# Patient Record
Sex: Male | Born: 1950 | Race: Asian | Hispanic: No | Marital: Married | Smoking: Former smoker
Health system: Southern US, Community
[De-identification: ages and names within clinical notes are randomized; demographics above are authoritative.]

## PROBLEM LIST (undated history)

## (undated) DIAGNOSIS — I10 Essential (primary) hypertension: Secondary | ICD-10-CM

## (undated) DIAGNOSIS — E785 Hyperlipidemia, unspecified: Secondary | ICD-10-CM

## (undated) DIAGNOSIS — G463 Brain stem stroke syndrome: Secondary | ICD-10-CM

## (undated) DIAGNOSIS — C801 Malignant (primary) neoplasm, unspecified: Secondary | ICD-10-CM

## (undated) DIAGNOSIS — R972 Elevated prostate specific antigen [PSA]: Secondary | ICD-10-CM

## (undated) DIAGNOSIS — R7303 Prediabetes: Secondary | ICD-10-CM

## (undated) DIAGNOSIS — D170 Benign lipomatous neoplasm of skin and subcutaneous tissue of head, face and neck: Secondary | ICD-10-CM

## (undated) DIAGNOSIS — Z87898 Personal history of other specified conditions: Secondary | ICD-10-CM

## (undated) HISTORY — DX: Essential (primary) hypertension: I10

## (undated) HISTORY — DX: Hyperlipidemia, unspecified: E78.5

## (undated) HISTORY — DX: Personal history of other specified conditions: Z87.898

## (undated) HISTORY — DX: Benign lipomatous neoplasm of skin and subcutaneous tissue of head, face and neck: D17.0

## (undated) HISTORY — DX: Brain stem stroke syndrome: G46.3

---

## 2008-04-20 ENCOUNTER — Other Ambulatory Visit: Payer: Self-pay

## 2008-04-20 ENCOUNTER — Inpatient Hospital Stay: Payer: Self-pay | Admitting: Internal Medicine

## 2008-04-26 ENCOUNTER — Ambulatory Visit: Payer: Self-pay | Admitting: Physical Medicine & Rehabilitation

## 2008-04-26 ENCOUNTER — Inpatient Hospital Stay (HOSPITAL_COMMUNITY)
Admission: RE | Admit: 2008-04-26 | Discharge: 2008-05-07 | Payer: Self-pay | Admitting: Physical Medicine & Rehabilitation

## 2008-05-02 DIAGNOSIS — G463 Brain stem stroke syndrome: Secondary | ICD-10-CM

## 2008-05-02 HISTORY — DX: Brain stem stroke syndrome: G46.3

## 2010-12-15 NOTE — Discharge Summary (Signed)
[name], [name]                     ACCOUNT NO.:  [id number]   MEDICAL RECORD NO.:  [id number]          PATIENT TYPE:  IPS   LOCATION:  4035                         FACILITY:  [facility]   PHYSICIAN:  Mariam Dollar, P.A.  DATE OF BIRTH:  25-Jan-1951   DATE OF ADMISSION:  04/26/2008  DATE OF DISCHARGE:  05/07/2008                               DISCHARGE SUMMARY   DISCHARGE ADDENDUM   The patient initially scheduled for discharge, May 03, 2008, however,  needed to gain improved functional mobility.  Prior to discharged to  home, there was some consideration.  He might need nursing home  placement due to the fact that his wife worked.  This was discussed at  length, wife felt that could be worked out between her work physician  and be able to provide necessary supervision in her home.  The patient  could be discharged to home.  Thus, full family teaching was completed.  He was discharged on May 07, 2008.  His diet remained unchanged,  mechanical soft nectar-thick liquids.  His blood pressures were well  controlled.  He would remain on his Lopressor, Vasotec, Norvasc, and  clonidine, and follow up with his primary care [name].  We will  continue to follow the patient for issues regarding his stroke on an  outpatient basis with Dr. Thomasena Edis.      Mariam Dollar, P.A.     DA/MEDQ  D:  05/06/2008  T:  05/06/2008  Job:  [id number]

## 2010-12-15 NOTE — H&P (Signed)
NAMEAMADOU, KATZENSTEIN                     ACCOUNT NO.:  [id number]   MEDICAL RECORD NO.:  [id number]          PATIENT TYPE:  IPS   LOCATION:  4035                         FACILITY:  [facility]   PHYSICIAN:  Ranelle Oyster, M.D.DATE OF BIRTH:  June 21, 1951   DATE OF ADMISSION:  04/26/2008  DATE OF DISCHARGE:                              HISTORY & PHYSICAL   CHIEF COMPLAINT:  Right-sided sensory loss and ataxia.   HISTORY OF PRESENT ILLNESS:  This is a 60 year old Asian male with  uncontrolled hypertension admitted to [facility]  on April 20, 2008 with nausea and dizziness with noted right-sided  weakness.  Blood pressure on admission was 205/112.  The patient was  placed on low-dose labetalol.  CT showed a basal ganglia infarct by  report.  There was a question whether this was old or new.  Upon looking  at discharge summary from [facility], MRI showed some subcortical  and deep white matter chronic changes.  There is discussion of Neurology  consult where the neurologist felt that the patient had a small  brainstem stroke.  Carotid Dopplers were negative.  Echocardiogram was  unremarkable.  The patient was placed on Plavix for stroke prophylaxis.  Blood pressures continued to be variable and other medications were held  including Vasotec, Norvasc, clonidine, and metoprolol.  The patient was  placed on subcu Lovenox for DVT prophylaxis.  He is requiring D3 honey-  thick liquid diet due to dysphagia.  The patient continues to struggle  with mobility swallowing and self-care and thus was admitted to the  inpatient rehab unit today.   REVIEW OF SYSTEMS:  Notable for hiccups, dizziness particularly when  changing physicians.  He has had some nausea but this has mostly  resolved.  He denies shortness of breath and chest pain.  He does  complain of numbness on the right arm and right face.  Full review is in  the written history and physical.   PAST MEDICAL HISTORY:   Positive for uncontrolled hypertension.   PAST SURGICAL HISTORY:  He has no surgical history.   HABITS:  He remotely smoked and does not drink.   FAMILY HISTORY:  Positive hypertension and hyperlipidemia.   SOCIAL HISTORY:  The patient is married, lives in [place], and owns a  [employer], trade union.  He has a 2-level house with bedroom upstairs and 3-4 steps  to enter.  Wife also works at the family business.   FUNCTIONAL HISTORY:  The patient is independent prior to arrival.  Currently, he is requiring min assist for gait and self-care due to  ataxia.   ALLERGIES:  None.   HOME MEDICATIONS:  None.   LABORATORY DATA:  Hemoglobin 16, white count 13, and platelets 265.  Sodium 139, potassium 3.9, BUN and creatinine 16 and 0.9.   PHYSICAL EXAMINATION:  VITAL SIGNS:  Blood pressure is 132/83, pulse is  76, respiratory rate 17, and temperature 98.8.  GENERAL:  The patient is pleasant, lying in bed.  Pupils equally round  and reactive to light.  EARS, NOSE, AND THROAT:  Unremarkable with pink moist mucosa and intact  dentition.  NECK:  Supple without JVD or lymphadenopathy.  CHEST:  Clear to auscultation bilaterally without wheezes, rales, or  rhonchi.  HEART:  Regular rate and rhythm without murmurs, rubs, or gallops.  ABDOMEN:  Soft, nontender.  Bowel sounds are positive.  SKIN:  Intact.  NEUROLOGIC:  Cranial nerve exam revealed decreased sensation over the  right face.  Visual tracking was intact, although he did have some  nystagmus with end range lateral movements.  Reflexes were 1+.  Sensation was decreased to 1/2 with the right arm and right face, but  not the right leg today.  Judgment, orientation, memory, and mood were  all within normal limits.  The patient did fall to the right when we sat  him up.  Romberg test was positive with lean to the right.  He had  minimal limb ataxia and more truncal ataxia than anything else today.  Strength was 5/5 in all 4 extremities.    ASSESSMENT AND PLAN:  1. Functional deficits secondary to acute cerebrovascular accident,      perhaps in the right lateral medullary area.  There is second-hand      report that the patient did suffer brainstem stroke, although      nothing is described on either of his radiographic reports.  The      patient with ongoing ataxia and significant dysphagia.  The patient      was admitted to receive collaborative interdisciplinary care      between the physiatrist, rehab nursing staff, and therapy team.      The patient's level of medical complexity and substantial therapy      needs in context of that medical necessity cannot be provided at a      lesser intensity of care.  Physiatrist will provide 24-hour      management of medical needs as well as oversight of therapy plan      and provide guidance as appropriate regarding interaction of the      two.  24-hour rehab nursing will assist in management of the      patient's safety issues as well as bowel and bladder incontinence,      appropriate nutrition, and management of nausea if this persists.      They also will help integrate therapy concepts, techniques,      education, etc.  PT will assess and treat for ataxia including      adaptive equipment as appropriate and desensitizing techniques for      loss of balance and position sense.  OT will assess and treat for      appropriate equipment, safety, and ADLs.  Speech language pathology      will assess and follow for ongoing dysphagia.  Case      management/social worker will assess and treat for psychosocial      issues and discharge planning.  The patient will receive 3 hours      therapy per day at least 5 days a week.  Team conferences will be      held weekly to establish goals, assess progress, and to determine      barriers to discharge.  Rehab goals are modified independent.      Estimated length of stay is 7 days.  Prognosis is good.  2. Blood pressure:  Continue to monitor  serially on metoprolol,      Vasotec, Norvasc, and clonidine.  Blood pressure was normal on  evaluation today.  3. Hyperlipidemia:  Continue Lipitor.  4. Stroke prophylaxis with Plavix as well as blood pressure:  Some      control as noted above.  5. Hiccups:  Discontinue Thorazine, begin scheduled baclofen as the      patient states Thorazine was not helpful for symptoms.  6. Deep venous thrombosis prophylaxis:  Subcu Lovenox for now.      Ranelle Oyster, M.D.  Electronically Signed     ZTS/MEDQ  D:  04/26/2008  T:  04/27/2008  Job:  [id number]

## 2010-12-15 NOTE — Discharge Summary (Signed)
NAMESHLOMA, ROGGENKAMP                     ACCOUNT NO.:  [id number]   MEDICAL RECORD NO.:  [id number]          PATIENT TYPE:  IPS   LOCATION:  4035                         FACILITY:  [facility]   PHYSICIAN:  Ellwood Dense, M.D.   DATE OF BIRTH:  1951-01-30   DATE OF ADMISSION:  04/26/2008  DATE OF DISCHARGE:  05/03/2008                               DISCHARGE SUMMARY   DISCHARGE DIAGNOSES:  1. Cerebrovascular accident.  2. Questionable old lacunar left basal ganglia infarction.  3. Dysphagia.  4. Hypertension.  5. Hyperlipidemia.  6. Hiccups.  7. Subcutaneous Lovenox for deep vein thrombosis prophylaxis.   HISTORY OF PRESENT ILLNESS:  This is a 60 year old Asian male with  history of untreated hypertension, admitted to [facility] on April 20, 2008, with nausea, dizziness, and right-sided  weakness.  Noted blood pressure 205, diastolic 112, placed on low-dose  labetalol.  Cranial CT scan showed old basal ganglia lacunar infarction  with diffuse atrophy.  Cardiac enzymes negative.  MRI with subcortical  deep white matter chronic ischemic changes.  Carotid Dopplers with  minimal plaque, no flow, limiting stenosis.  Echocardiogram with  ejection fraction of 55% without emboli.  Placed on Plavix for  cerebrovascular accident prophylaxis.  Blood pressure with variables  with metoprolol, Vasotec, Norvasc, and clonidine added.  He was  maintained on subcutaneous Lovenox for deep vein thrombosis prophylaxis.  A mechanical soft honey-thick liquid diet per Speech Therapy secondary  to dysphagia related to cerebrovascular accident.  He was admitted for  comprehensive rehab program.   PAST MEDICAL HISTORY:  See discharge diagnoses.  Remote smoker.  No  alcohol.   ALLERGIES:  None.   SOCIAL HISTORY:  He is married.  He lives in [place].  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  Wife works at the family business.   FUNCTIONAL  HISTORY PRIOR TO ADMISSION:  Independent.   FUNCTIONAL STATUS UPON ADMISSION:  Rehab Services with minimal assist  secondary to ataxia.   MEDICATIONS PRIOR TO ADMISSION:  None.   PHYSICAL EXAMINATION:  VITAL SIGNS:  Blood pressure 132/83, pulse 76,  temperature 98.8, and respirations 17.  NEUROLOGIC:  The patient was alert and oriented x3, followed simple  commands.  LUNGS:  Clear to auscultation.  CARDIAC:  Regular rate and rhythm.  ABDOMEN:  Soft and nontender.  Good bowel sounds.  EXTREMITIES:  Calves remain cool without any swelling, erythema, or  nontender.  He was ataxic with fine motor commands.   REHABILITATION HOSPITAL COURSE:  The patient was admitted to Inpatient  Rehab Services with therapies initiated on a 3-hour daily basis  consisting of physical therapy, occupational therapy, speech therapy,  and 24-hour rehabilitation nursing, and the following issues were  addressed during the patient's rehabilitation stay.   Pertaining to Mr. Pattillo cerebrovascular accident, he remained on Plavix  therapy.  Subcutaneous Lovenox ongoing for deep vein thrombosis  prophylaxis.  He was still somewhat ataxic and would need supervision at  home.  Overall, for his functional status,  he was supervision for  bathing, dressing, modified independent for toileting and toilet  transfer, supervision transfers, minimal assistance again for ambulation  secondary to his ataxia.  He was followed by Speech Therapy for  swallowing difficulties related to a stroke.  He is currently on a  mechanical soft diet with nectar thick liquids and would be followed up  per Speech Therapy.  Blood pressures monitored on metoprolol, Vasotec,  Norvasc, and clonidine.  It was stressed to the patient the need to  maintain these antihypertensive medications to control blood pressure.  It was to be arranged to follow up with a primary MD.  He was placed on  Lipitor during his workup for stroke for hyperlipidemia.  He  did have  some hiccups during his hospital course.  He had received baclofen with  relatively good results.  Weekly interdisciplinary team conferences were  held to discuss the patient's progress, estimated length of stays, any  barriers to discharge, and full family teaching being completed.  He had  no bowel or bladder disturbances.  He was discharged home on May 03, 2008.   DISCHARGE MEDICATIONS AT TIME OF DICTATION:  1. Crestor 20 mg at bedtime.  2. Lopressor 50 mg every 12 hours.  3. Vasotec 20 mg twice daily.  4. Norvasc 10 mg daily.  5. Plavix 75 mg daily.  6. Clonidine 0.1 mg twice daily.   DIET:  Mechanical soft nectar thick liquids.   INSTRUCTIONS:  Follow up per Speech Therapy.  He would follow up with  Dr. Ellwood Dense at the Outpatient Rehab Service Office as advised.  Arrangements had been made with case management to follow up with the  primary care Kamryn Messineo.  He was advised no smoking, no drinking, no  alcohol.      Mariam Dollar, P.A.    ______________________________  Ellwood Dense, M.D.    DA/MEDQ  D:  05/02/2008  T:  05/02/2008  Job:  [id number]

## 2011-05-03 LAB — COMPREHENSIVE METABOLIC PANEL
ALT: 76 — ABNORMAL HIGH
Alkaline Phosphatase: 81
Chloride: 102
Creatinine, Ser: 0.93
GFR calc Af Amer: >60
Total Bilirubin: 0.9

## 2011-05-03 LAB — CBC
HCT: 49.3
MCHC: 33.7
MCV: 88.6
Platelets: 272
RDW: 12.6
WBC: 11 — ABNORMAL HIGH

## 2011-05-03 LAB — DIFFERENTIAL
Lymphs Abs: 2.1
Monocytes Relative: 11
Neutro Abs: 7.5

## 2012-08-02 HISTORY — PX: LIPOMA EXCISION: SHX5283

## 2013-02-19 ENCOUNTER — Ambulatory Visit: Payer: Self-pay | Admitting: Anesthesiology

## 2013-04-18 ENCOUNTER — Ambulatory Visit: Payer: Self-pay | Admitting: Surgery

## 2013-04-24 ENCOUNTER — Ambulatory Visit: Payer: Self-pay | Admitting: Surgery

## 2013-04-24 LAB — CBC WITH DIFFERENTIAL/PLATELET
Basophil #: 0 10*3/uL (ref 0.0–0.1)
Basophil %: 0.2 %
HCT: 43 % (ref 40.0–52.0)
Lymphocyte #: 0.9 10*3/uL — ABNORMAL LOW (ref 1.0–3.6)
Lymphocyte %: 5.5 %
MCHC: 33.7 g/dL (ref 32.0–36.0)
MCV: 90 fL (ref 80–100)
Monocyte #: 0.2 x10 3/mm (ref 0.2–1.0)
Platelet: 200 10*3/uL (ref 150–440)
RDW: 12.5 % (ref 11.5–14.5)

## 2013-04-24 LAB — PROTIME-INR
INR: 1
Prothrombin Time: 13.3 secs (ref 11.5–14.7)

## 2013-04-26 LAB — PATHOLOGY REPORT

## 2014-11-22 NOTE — Op Note (Signed)
PATIENT NAMECONSTANTIN, [name] MR#:  [id number] DATE OF BIRTH:  11/22/1950  DATE OF PROCEDURE:  04/24/2013  PREOPERATIVE DIAGNOSIS: Subfascial lipoma of the neck.  POSTOPERATIVE DIAGNOSIS:  Subfascial lipoma of the neck.    PROCEDURE: Excision of lipoma the neck.   SURGEON: Rochel Brome, M.D.   ANESTHESIA: General.   INDICATIONS: This 64 year old male has a history of a large mass of the right side of the neck. According to the physical findings, it was approximately 12 cm in dimension and there appeared to be a low suspicion for a sarcoma but most likely a large lipoma. An excision was recommended due to potential for additional growth.   DESCRIPTION OF PROCEDURE:  The patient was placed on the operating table in the supine position under general anesthesia. A roll was placed behind the shoulder blades. The neck was extended. The head was turned 20 degrees to the left. The mass was readily identified and the site was prepared with ChloraPrep and draped in a sterile manner.   A curvilinear incision was made at the base of the neck in the supraclavicular area, approximately 7 cm in length.  It was carried down through subcutaneous tissues through the platysma. The external jugular vein was divided between 3-0 chromic suture ligatures. Dissection was carried down through some additional fatty tissue to encounter a lipoma which had a smooth plane of dissection.  This plane was dissected circumferentially around the lipoma. It did extend deeply within the neck, particularly posteriorly where it extended under a portion of trapezius. I palpated the first rib. There was also a portion of brachial plexus visualized anteriorly. The dissection of the lipoma was continued in a somewhat tedious fashion. Innumerable tiny blood vessels along the surface of the lipoma were divided with electrocautery. Numerous vessels were ligated with 3-0 and 4-0 ties and suture ligatures, and the lipoma was further dissected. There  were 2 vascular pedicles also ligated with 2-0 chromic and the lipoma was completely resected. The ex vivo measurement was 20 x 6 x 4 cm. It was placed in formalin for routine pathology. The wound was inspected and hemostasis appeared to be intact.   Next, the platysma was closed with interrupted inverted 3-0 chromic sutures and then the skin was closed with running 5-0 Monocryl subcuticular suture and Dermabond. The patient appeared to be in satisfactory condition and was maintained in reversed Trendelenburg position and prepared for transfer to the recovery room.   ____________________________ [name]. Rochel Brome, MD [email or web address] D: 04/24/2013 17:34:00 ET T: 04/24/2013 18:08:38 ET JOB#: [id number]  cc: Loreli Dollar, MD, <Dictator> Loreli Dollar MD ELECTRONICALLY SIGNED 04/25/2013 17:25

## 2014-11-22 NOTE — Op Note (Signed)
PATIENT NAMETORI, [name] MR#:  [id number] DATE OF BIRTH:  [birth year]  DATE OF PROCEDURE:  04/24/2013  PREOPERATIVE DIAGNOSIS: A postoperative hematoma of the neck.   POSTOPERATIVE DIAGNOSIS: A postoperative hematoma of the neck.  PROCEDURE:  Incision and drainage of hematoma with control of bleeding.   SURGEON: Rochel Brome, MD   ANESTHESIA: General.   INDICATIONS: This 64 year old had excision of a large subfascial hematoma of the right side of the neck earlier today and in the postoperative period has developed marked swelling at the operative site. It appeared that he may become in danger of respiratory distress and recommended incision and drainage.   DESCRIPTION OF PROCEDURE: The patient was placed on the operating table in the supine position. The rolled sheet was placed behind the shoulder blades. The neck and head was placed on a donut ring. The neck was extended. The operative site was prepared with ChloraPrep and draped in a sterile manner. Next, the skin at the anterior 3 cm of the incision was infiltrated with 1% Xylocaine with epinephrine and removed glue and incised through the same incision, dividing sutures, and demonstrated the presence of hematoma and evacuated blood with a suction cannula until it was fairly well decompressed; and after this, while maintaining the sterile field, the anesthesia staff placed him under general endotracheal anesthesia.   The rest of the wound was opened removing suture material. There was a wide area of ecchymosis. Some additional blood clots were evacuated with suction and manually.  The wound was then explored. There were no bleeding points identified, but several small bleeding points were cauterized. Malleable retractors were used to explore deeply within the wound as the wound did extend some 5 inches in the posterior direction, which is the site where the portion of the lipoma had been. It appeared that it was adequately decompressed, and the  wound was further explored, seeing no bleeding vessels other than a few small areas which were cauterized. Next, a 19-French  Blake drain was inserted and brought out through a stab wound which was some 4 cm lateral to the incision. This site was infiltrated with 1% Xylocaine, and I made a lancing incision with a 15 scalpel and inserted the Blake drain, brought out through the stab wound. The Blake drain was cut to fit, and using malleable retractors I could see the deepest posterior portion of the wound and passed the tip of the [place] drain down as far as it would go, and it appeared to lay satisfactorily; and after some other brief exploration, no other bleeding points were seen. The platysma was closed with interrupted inverted 3-0 chromic sutures. The skin was closed with a running 5-0 Monocryl subcuticular suture and Dermabond. It was further noted that the drain was secured with 3-0 nylon which that stitch was actually placed as soon as the drain was brought out. The drain site was also treated with Dermabond, and also this was dressed with benzoin and Tegaderm. I also applied a piece of 2-inch silk tape to secure the drain.  There was a minimal amount of serosanguineous drainage amounting to some 10 mL within the collection chamber which was activated.   The patient did have some hypotension during the procedure, was treated with fluids and body position and subsequently improved, and the patient is now being prepared for transfer to the recovery room.   ____________________________ Lenna Sciara. Rochel Brome, MD [email or web address] D: 04/24/2013 20:37:21 ET T: 04/24/2013 21:17:30 ET JOB#: [id number]  cc: Loreli Dollar,  MD, <Dictator> Loreli Dollar MD ELECTRONICALLY SIGNED 04/25/2013 17:26

## 2016-08-02 DIAGNOSIS — R972 Elevated prostate specific antigen [PSA]: Secondary | ICD-10-CM

## 2016-08-02 DIAGNOSIS — R7303 Prediabetes: Secondary | ICD-10-CM

## 2016-08-02 DIAGNOSIS — C801 Malignant (primary) neoplasm, unspecified: Secondary | ICD-10-CM

## 2016-08-02 HISTORY — DX: Malignant (primary) neoplasm, unspecified: C80.1

## 2016-08-02 HISTORY — DX: Elevated prostate specific antigen (PSA): R97.20

## 2016-08-02 HISTORY — DX: Prediabetes: R73.03

## 2017-05-17 ENCOUNTER — Ambulatory Visit: Payer: BLUE CROSS/BLUE SHIELD | Admitting: Urology

## 2017-05-17 ENCOUNTER — Encounter: Payer: Self-pay | Admitting: Urology

## 2017-05-17 VITALS — BP 172/89 | HR 70 | Ht 65.0 in | Wt 162.0 lb

## 2017-05-17 DIAGNOSIS — R972 Elevated prostate specific antigen [PSA]: Secondary | ICD-10-CM | POA: Diagnosis not present

## 2017-05-17 NOTE — Progress Notes (Signed)
05/17/2017 9:27 AM   [name] 07/19/1951 [id number]  Referring provider: Idelle Crouch, MD [facility] [place], [place] [address]  Chief Complaint  Patient presents with  . Elevated PSA    HPI: 66 year old male referred for further evaluation of elevated and rising PSA. PSA trend as below. His most recent PSA is now up to 5.61 on 04/18/2017.  Repeat PSA was drawn today and is pending.  Loss of bone pain.  No family history of prostate cancer  He has never had a previous prostate biopsy or evaluation for elevated PSA in the past.  He denies any baseline urinary symptoms.  He has an excellent stream, feels like he is able to empty his bladder completely, without urgency or frequency.  IPSS as below.  PSA: 1 11/13/2008 2.54 04/12/2014 3.13 10/29/2015 5.61 04/18/2017      IPSS    Row Name 05/17/17 1500         International Prostate Symptom Score   How often have you had the sensation of not emptying your bladder? Not at All     How often have you had to urinate less than every two hours? Not at All     How often have you found you stopped and started again several times when you urinated? Not at All     How often have you found it difficult to postpone urination? Not at All     How often have you had a weak urinary stream? Less than 1 in 5 times     How often have you had to strain to start urination? Not at All     How many times did you typically get up at night to urinate? 1 Time     Total IPSS Score 2       Quality of Life due to urinary symptoms   If you were to spend the rest of your life with your urinary condition just the way it is now how would you feel about that? Delighted        Score:  1-7 Mild 8-19 Moderate 20-35 Severe   PMH: Past Medical History:  Diagnosis Date  . Benign hypertension 05/22/2017  . 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 History of vertigo  05/22/2017  . Hyperlipidemia, unspecified 05/22/2017  . Lipoma of neck 05/22/2017   Overview:  right  . Type 2 diabetes mellitus ([place]) 05/22/2017    Surgical History: Past Surgical History:  Procedure Laterality Date  . LIPOMA EXCISION      Home Medications:  Allergies as of 05/17/2017   No Known Allergies     Medication List       Accurate as of 05/17/17 11:59 PM. Always use your most recent med list.          amLODipine 5 MG tablet Commonly known as:  NORVASC Take 1 tablet by mouth daily.   atorvastatin 40 MG tablet Commonly known as:  LIPITOR Take 1 tablet by mouth daily.   enalapril 20 MG tablet Commonly known as:  VASOTEC Take 1 tablet by mouth daily.   metoprolol tartrate 50 MG tablet Commonly known as:  LOPRESSOR Take 1 tablet by mouth daily.       Allergies: No Known Allergies  Family History: Family History  Problem Relation Age of Onset  . Heart attack Father     Social History:  reports that he has never  smoked. He has never used smokeless tobacco. His alcohol and drug histories are not on file.  ROS: UROLOGY Frequent Urination?: No Hard to postpone urination?: No Burning/pain with urination?: No Get up at night to urinate?: No Leakage of urine?: No Urine stream starts and stops?: No Trouble starting stream?: No Do you have to strain to urinate?: No Blood in urine?: No Urinary tract infection?: No Sexually transmitted disease?: No Injury to kidneys or bladder?: No Painful intercourse?: No Weak stream?: No Erection problems?: No Penile pain?: No  Gastrointestinal Nausea?: No Vomiting?: No Indigestion/heartburn?: No Diarrhea?: No Constipation?: No  Constitutional Fever: No Night sweats?: No Weight loss?: No Fatigue?: No  Skin Skin rash/lesions?: No Itching?: No  Eyes Blurred vision?: No Double vision?: No  Ears/Nose/Throat Sore throat?: No Sinus problems?: No  Hematologic/Lymphatic Swollen glands?: No Easy  bruising?: No  Cardiovascular Leg swelling?: No Chest pain?: No  Respiratory Cough?: No Shortness of breath?: No  Endocrine Excessive thirst?: No  Musculoskeletal Back pain?: No Joint pain?: No  Neurological Headaches?: No Dizziness?: No  Psychologic Depression?: No Anxiety?: No  Physical Exam: BP (!) 172/89 (BP Location: Left Arm, Patient Position: Sitting, Cuff Size: Normal)   Pulse 70   Ht 5\' 5"  (1.651 m)   Wt 162 lb (73.5 kg)   BMI 26.96 kg/m   Constitutional:  Alert and oriented, No acute distress.  But he by wife today.  Initially used language line, [place] translator, however, patient appeared to understand everything I was saying and translation was not needed further. HEENT: [place] AT, moist mucus membranes.  Trachea midline, no masses. Cardiovascular: No clubbing, cyanosis, or edema. Respiratory: Normal respiratory effort, no increased work of breathing. GI: Abdomen is soft, nontender, nondistended, no abdominal masses GU: No CVA tenderness.  : Normal sphincter tone, enlarged prostate, nontender, no nodules. Skin: No rashes, bruises or suspicious lesions. Lymph: No inguinal adenopathy. Neurologic: Grossly intact, no focal deficits, moving all 4 extremities. Psychiatric: Normal mood and affect.  Laboratory Data: Lab Results  Component Value Date   WBC 16.0 (H) 04/24/2013   HGB 13.3 04/25/2013   HCT 43.0 04/24/2013   MCV 90 04/24/2013   PLT 200 04/24/2013    Lab Results  Component Value Date   CREATININE 0.93 04/29/2008    Lab Results  Component Value Date   PSA1 5.3 (H) 05/17/2017    Urinalysis N/a  Pertinent Imaging: N/a  Assessment & Plan:    1. Elevated PSA  We reviewed the implications of an elevated PSA and the uncertainty surrounding it. In general, a man's PSA increases with age and is produced by both normal and cancerous prostate tissue. Differential for elevated PSA is BPH, prostate cancer, infection, recent  intercourse/ejaculation, prostate infarction, recent urethroscopic manipulation (foley placement/cystoscopy) and prostatitis. Management of an elevated PSA can include observation or prostate biopsy and wediscussed this in detail. We discussed that indications for prostate biopsy are defined by age and race specific PSA cutoffs as well as a PSA velocity of 0.75/year.  We discussed today that although his absolute PSA is just above the normal range, his PSA velocity and overall rising PSA trend is highly concerning.  PSA repeated today.  I advised that if his PSA continues to be elevated, would highly recommend prostate biopsy.  We will call him and let us know her final recommendations based on his labs today.  We discussed prostate biopsy in detail including the procedure itself, the risks of blood in the urine, stool, and ejaculate, serious  infection, and discomfort. He is willing to proceed with this as discussed. - PSA  \ Hollice Espy, MD  [facility] [address], [place], [place] [address] [phone]

## 2017-05-18 ENCOUNTER — Telehealth: Payer: Self-pay

## 2017-05-18 LAB — PSA: Prostate Specific Ag, Serum: 5.3 ng/mL — ABNORMAL HIGH (ref 0.0–4.0)

## 2017-05-18 NOTE — Telephone Encounter (Signed)
Spoke with pt wife in reference to PSA results and needing prostate bx. Made wife aware of prep for bx. Bx was scheduled. Was not able to get results appt with Dr. Erlene Quan therefore results appt is with an [facility] provider. Wife was made aware. Wife voiced understanding. Prep and appts are mailed to pt.

## 2017-05-18 NOTE — Telephone Encounter (Signed)
-----   Message from Hollice Espy, MD sent at 05/18/2017  7:49 AM EDT ----- PSA is still elevated, 5.3.  Recommend proceeding with prostate biopsy as discussed in the office. Please mail him instruction sheet and schedule.  Hollice Espy, MD

## 2017-05-22 ENCOUNTER — Encounter: Payer: Self-pay | Admitting: Urology

## 2017-05-22 DIAGNOSIS — E119 Type 2 diabetes mellitus without complications: Secondary | ICD-10-CM | POA: Insufficient documentation

## 2017-05-22 DIAGNOSIS — E785 Hyperlipidemia, unspecified: Secondary | ICD-10-CM | POA: Insufficient documentation

## 2017-05-22 DIAGNOSIS — I10 Essential (primary) hypertension: Secondary | ICD-10-CM

## 2017-05-22 DIAGNOSIS — G463 Brain stem stroke syndrome: Secondary | ICD-10-CM | POA: Insufficient documentation

## 2017-05-22 DIAGNOSIS — D170 Benign lipomatous neoplasm of skin and subcutaneous tissue of head, face and neck: Secondary | ICD-10-CM

## 2017-05-22 DIAGNOSIS — Z87898 Personal history of other specified conditions: Secondary | ICD-10-CM | POA: Insufficient documentation

## 2017-05-22 HISTORY — DX: Essential (primary) hypertension: I10

## 2017-05-22 HISTORY — DX: Hyperlipidemia, unspecified: E78.5

## 2017-05-22 HISTORY — DX: Personal history of other specified conditions: Z87.898

## 2017-05-22 HISTORY — DX: Benign lipomatous neoplasm of skin and subcutaneous tissue of head, face and neck: D17.0

## 2017-06-01 ENCOUNTER — Other Ambulatory Visit: Payer: BLUE CROSS/BLUE SHIELD | Admitting: Urology

## 2017-06-09 ENCOUNTER — Ambulatory Visit: Payer: BLUE CROSS/BLUE SHIELD | Admitting: Urology

## 2017-06-09 ENCOUNTER — Other Ambulatory Visit: Payer: Self-pay | Admitting: Urology

## 2017-06-09 ENCOUNTER — Encounter: Payer: Self-pay | Admitting: Urology

## 2017-06-09 VITALS — BP 149/86 | HR 66 | Ht 65.0 in | Wt 162.0 lb

## 2017-06-09 DIAGNOSIS — R972 Elevated prostate specific antigen [PSA]: Secondary | ICD-10-CM | POA: Diagnosis not present

## 2017-06-09 MED ORDER — GENTAMICIN SULFATE 40 MG/ML IJ SOLN
80.0000 mg | Freq: Once | INTRAMUSCULAR | Status: AC
  Administered 2017-06-09: 80 mg via INTRAMUSCULAR

## 2017-06-09 MED ORDER — LEVOFLOXACIN 500 MG PO TABS
500.0000 mg | ORAL_TABLET | Freq: Once | ORAL | Status: AC
  Administered 2017-06-09: 500 mg via ORAL

## 2017-06-09 NOTE — Progress Notes (Signed)
   06/09/17  CC:  Chief Complaint  Patient presents with  . Prostate Biopsy    HPI: 66 year old male with a rising PSA, up to 5.61 who presents today for prostate biopsy.  Rectal exam was unremarkable.  Blood pressure (!) 149/86, pulse 66, height 5\' 5"  (1.651 m), weight 162 lb (73.5 kg). NED. A&Ox3.   No respiratory distress   Abd soft, NT, ND Normal sphincter tone  Prostate Biopsy Procedure   Informed consent was obtained after discussing risks/benefits of the procedure.  A time out was performed to ensure correct patient identity.  Pre-Procedure: - Gentamicin given prophylactically - Levaquin 500 mg administered PO -Transrectal Ultrasound performed revealing a 44 gm prostate -There was evidence of intravesical protrusion of a small median lobe into the bladder.  There are also some worrisome hypoechoic lesions on the left side of the prostate gland.  Procedure: - Prostate block performed using 10 cc 1% lidocaine and biopsies taken from sextant areas, a total of 12 under ultrasound guidance.  Post-Procedure: - Patient tolerated the procedure well - He was counseled to seek immediate medical attention if experiences any severe pain, significant bleeding, or fevers - Return in one week to discuss biopsy results  Hollice Espy, MD

## 2017-06-17 ENCOUNTER — Ambulatory Visit: Payer: Self-pay

## 2017-06-17 LAB — PATHOLOGY REPORT

## 2017-06-20 ENCOUNTER — Ambulatory Visit: Payer: Self-pay

## 2017-06-21 ENCOUNTER — Ambulatory Visit (INDEPENDENT_AMBULATORY_CARE_PROVIDER_SITE_OTHER): Payer: BLUE CROSS/BLUE SHIELD | Admitting: Urology

## 2017-06-21 ENCOUNTER — Encounter: Payer: Self-pay | Admitting: Urology

## 2017-06-21 ENCOUNTER — Other Ambulatory Visit: Payer: Self-pay | Admitting: Urology

## 2017-06-21 VITALS — BP 146/80 | HR 67 | Ht 65.0 in | Wt 168.0 lb

## 2017-06-21 DIAGNOSIS — C61 Malignant neoplasm of prostate: Secondary | ICD-10-CM | POA: Diagnosis not present

## 2017-06-21 NOTE — Progress Notes (Signed)
06/21/2017 11:34 AM   [name] 1950/12/07 [id number]  Referring provider: Idelle Crouch, MD [facility] [place], [place] [address]  Chief Complaint  Patient presents with  . Prostate Cancer    HPI: 66 year old male with newly diagnosed prostate cancer who returns today to discuss his pathology report.  He was referred for elevated PSA up to 5.61.  It has been rising slowly over the past several years.  Rectal exam was unremarkable.  TRUS vol 44 g.  On ultrasound, small median lobe was noted well as hypoechoic lesions on the left side of the prostate.  Prostate biopsy revealed 9 of 12 cores positive for prostate cancer, Gleason 4+4 involving all cores on the left up to 98% as well as Gleason 3+3 and 3+4 and 3 cores on the right.  IPSS/ SHIM below.    IPSS    Row Name 05/17/17 1500         International Prostate Symptom Score   How often have you had the sensation of not emptying your bladder?  Not at All     How often have you had to urinate less than every two hours?  Not at All     How often have you found you stopped and started again several times when you urinated?  Not at All     How often have you found it difficult to postpone urination?  Not at All     How often have you had a weak urinary stream?  Less than 1 in 5 times     How often have you had to strain to start urination?  Not at All     How many times did you typically get up at night to urinate?  1 Time     Total IPSS Score  2       Quality of Life due to urinary symptoms   If you were to spend the rest of your life with your urinary condition just the way it is now how would you feel about that?  Delighted        Score:  1-7 Mild 8-19 Moderate 20-35 Severe  SHIM    Row Name 05/17/17 1506         SHIM: Over the last 6 months:   How do you rate your confidence that you could get and keep an erection?  Moderate     When you had erections with sexual stimulation, how  often were your erections hard enough for penetration (entering your partner)?  Sometimes (about half the time)     During sexual intercourse, how often were you able to maintain your erection after you had penetrated (entered) your partner?  Sometimes (about half the time)     During sexual intercourse, how difficult was it to maintain your erection to completion of intercourse?  Slightly Difficult     When you attempted sexual intercourse, how often was it satisfactory for you?  Most Times (much more than half the time)       SHIM Total Score   SHIM  17        PMH: Past Medical History:  Diagnosis Date  . Benign hypertension 05/22/2017  . 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 History of vertigo 05/22/2017  . Hyperlipidemia, unspecified 05/22/2017  . Lipoma of neck 05/22/2017   Overview:  right  . Type 2 diabetes mellitus ([place]) 05/22/2017  Surgical History: Past Surgical History:  Procedure Laterality Date  . LIPOMA EXCISION      Home Medications:  Allergies as of 06/21/2017   No Known Allergies     Medication List        Accurate as of 06/21/17 11:34 AM. Always use your most recent med list.          amLODipine 5 MG tablet Commonly known as:  NORVASC Take 1 tablet by mouth daily.   atorvastatin 40 MG tablet Commonly known as:  LIPITOR Take 1 tablet by mouth daily.   enalapril 20 MG tablet Commonly known as:  VASOTEC Take 1 tablet by mouth daily.   metoprolol tartrate 50 MG tablet Commonly known as:  LOPRESSOR Take 1 tablet by mouth daily.       Allergies: No Known Allergies  Family History: Family History  Problem Relation Age of Onset  . Heart attack Father     Social History:  reports that  has never smoked. he has never used smokeless tobacco. His alcohol and drug histories are not on file.  ROS: UROLOGY Frequent Urination?: No Hard to postpone urination?: No Burning/pain with  urination?: No Get up at night to urinate?: No Leakage of urine?: No Urine stream starts and stops?: No Trouble starting stream?: No Do you have to strain to urinate?: No Blood in urine?: No Urinary tract infection?: No Sexually transmitted disease?: No Injury to kidneys or bladder?: No Painful intercourse?: No Weak stream?: No Erection problems?: No Penile pain?: No  Gastrointestinal Nausea?: No Vomiting?: No Indigestion/heartburn?: No Diarrhea?: No Constipation?: No  Constitutional Fever: No Night sweats?: No Weight loss?: No Fatigue?: No  Skin Skin rash/lesions?: No Itching?: No  Eyes Blurred vision?: No Double vision?: No  Ears/Nose/Throat Sore throat?: No Sinus problems?: No  Hematologic/Lymphatic Swollen glands?: No Easy bruising?: No  Cardiovascular Leg swelling?: No Chest pain?: No  Respiratory Cough?: No Shortness of breath?: No  Endocrine Excessive thirst?: No  Musculoskeletal Back pain?: No Joint pain?: No  Neurological Headaches?: No Dizziness?: No  Psychologic Depression?: No Anxiety?: No  Physical Exam: BP (!) 146/80   Pulse 67   Ht 5\' 5"  (1.651 m)   Wt 168 lb (76.2 kg)   BMI 27.96 kg/m   Constitutional:  Alert and oriented, No acute distress.  Accompanied by wife today HEENT: [place] AT, moist mucus membranes.  Trachea midline, no masses. Cardiovascular: No clubbing, cyanosis, or edema. Respiratory: Normal respiratory effort, no increased work of breathing. Skin: No rashes, bruises or suspicious lesions. Neurologic: Grossly intact, no focal deficits, moving all 4 extremities. Psychiatric: Normal mood and affect.  Laboratory Data: Lab Results  Component Value Date   WBC 16.0 (H) 04/24/2013   HGB 13.3 04/25/2013   HCT 43.0 04/24/2013   MCV 90 04/24/2013   PLT 200 04/24/2013    Lab Results  Component Value Date   CREATININE 0.93 04/29/2008    Lab Results  Component Value Date   PSA1 5.3 (H) 05/17/2017    Urinalysis N/A  Pertinent Imaging: N/A   Assessment & Plan:    1. Prostate cancer [facility]) Newly diagnosed high risk Gleason 4+4 prostate cancer, high volume Given high risk disease, recommend further stratification with CT abdomen pelvis/bone scan to rule out metastatic disease Briefly discussed natural history of prostate cancer as well as treatment options depending on if it is localized versus metastatic 100 questions was given today along with his pathology results Follow-up imaging called to discuss case with the patient's  daughter, Herschel Senegal, who is a Software engineer in [place]  All questions answered, return to care following staging  - CT Abdomen Pelvis W Contrast; Future - NM Bone Scan Whole Body; Future   Hollice Espy, MD  [facility] [address] [place], [place] [address] [phone]

## 2017-06-22 ENCOUNTER — Telehealth: Payer: Self-pay | Admitting: Urology

## 2017-06-22 NOTE — Telephone Encounter (Signed)
Pt wife called office to let office know that pt 11/27 bone scan and CT appts have been cancelled and rescheduled til January 2 due to deductible costs. Pt wife asking if this is okay? She's very concerned about the insurance and deductible of $3k.  Please advise. Thanks.

## 2017-06-22 NOTE — Telephone Encounter (Signed)
Its not ideal but will likely be OK. Certainly, there is a risk of progression during this time but if this needs to happen for financial concerns, we will have to wait.    Hollice Espy, MD

## 2017-06-22 NOTE — Telephone Encounter (Signed)
Spoke with pt wife in reference to waiting until Jan to have bone scan. Made wife aware that there is a risk of cancer progression. Wife voiced understanding.

## 2017-06-28 ENCOUNTER — Ambulatory Visit: Payer: BLUE CROSS/BLUE SHIELD

## 2017-06-30 ENCOUNTER — Ambulatory Visit: Payer: BLUE CROSS/BLUE SHIELD

## 2017-07-04 ENCOUNTER — Ambulatory Visit: Payer: BLUE CROSS/BLUE SHIELD

## 2017-07-05 ENCOUNTER — Ambulatory Visit
Admission: RE | Admit: 2017-07-05 | Discharge: 2017-07-05 | Disposition: A | Discharge status: Home / Self Care | Payer: BLUE CROSS/BLUE SHIELD | Source: Ambulatory Visit | Attending: Urology | Admitting: Urology

## 2017-07-05 DIAGNOSIS — K8020 Calculus of gallbladder without cholecystitis without obstruction: Secondary | ICD-10-CM | POA: Diagnosis not present

## 2017-07-05 DIAGNOSIS — C61 Malignant neoplasm of prostate: Secondary | ICD-10-CM | POA: Insufficient documentation

## 2017-07-05 DIAGNOSIS — I700 Atherosclerosis of aorta: Secondary | ICD-10-CM | POA: Diagnosis not present

## 2017-07-05 LAB — POCT I-STAT CREATININE: Creatinine, Ser: 1 mg/dL (ref 0.61–1.24)

## 2017-07-05 MED ORDER — IOPAMIDOL (ISOVUE-300) INJECTION 61%
100.0000 mL | Freq: Once | INTRAVENOUS | Status: AC | PRN
  Administered 2017-07-05: 100 mL via INTRAVENOUS

## 2017-08-03 ENCOUNTER — Other Ambulatory Visit: Payer: BLUE CROSS/BLUE SHIELD

## 2017-08-03 ENCOUNTER — Encounter
Admission: RE | Admit: 2017-08-03 | Discharge: 2017-08-03 | Disposition: A | Discharge status: Home / Self Care | Payer: BLUE CROSS/BLUE SHIELD | Source: Ambulatory Visit | Attending: Urology | Admitting: Urology

## 2017-08-03 DIAGNOSIS — C61 Malignant neoplasm of prostate: Secondary | ICD-10-CM | POA: Diagnosis not present

## 2017-08-03 MED ORDER — TECHNETIUM TC 99M MEDRONATE IV KIT
25.0000 | PACK | Freq: Once | INTRAVENOUS | Status: AC | PRN
  Administered 2017-08-03: 23 via INTRAVENOUS

## 2017-08-12 ENCOUNTER — Encounter: Payer: Self-pay | Admitting: Urology

## 2017-08-12 ENCOUNTER — Ambulatory Visit (INDEPENDENT_AMBULATORY_CARE_PROVIDER_SITE_OTHER): Payer: BLUE CROSS/BLUE SHIELD | Admitting: Urology

## 2017-08-12 VITALS — BP 170/95 | HR 73 | Ht 65.0 in | Wt 160.0 lb

## 2017-08-12 DIAGNOSIS — C61 Malignant neoplasm of prostate: Secondary | ICD-10-CM | POA: Diagnosis not present

## 2017-08-12 NOTE — Patient Instructions (Signed)
Discussed importance of bone health on ADT, recommend 1000-1200 mg daily calcium suppliment and 800-1000 IU vit D daily.  Also encouraged weight being exercises and cardiovascular health.  

## 2017-08-12 NOTE — Progress Notes (Signed)
08/12/2017 3:27 PM   [name] 01-06-1951 [id number]  Referring provider: Idelle Crouch, MD [facility] [place], [place] [address]  Chief Complaint  Patient presents with  . Prostate Cancer    Results    HPI: 67 year old male with newly diagnosed prostate cancer who returns today after follow-up staging to discuss treatment options.  He was referred for elevated PSA up to 5.61.  It has been rising slowly over the past several years.  Rectal exam was unremarkable.  TRUS vol 44 g.  On ultrasound, small median lobe was noted well as hypoechoic lesions on the left side of the prostate.  Prostate biopsy revealed 9 of 12 cores positive for prostate cancer, Gleason 4+4 involving all cores on the left up to 98% as well as Gleason 3+3 and 3+4 and 3 cores on the right.  He is undergone interval staging since his last visit.  This included a CT abdomen pelvis with contrast as well as bone scan.  These are both negative for evidence of metastatic disease.  It appears to be a localized process in the pelvis.  No personal history of erectile dysfunction.  No significant baseline urinary symptoms.    MSK nomogram indicates a 99% 10-year survival 41% 5-year progression free survival 18% organ confined disease 80% extracapsular extension 27% lymph node involvement 32% seminal vesicle involvement   PMH: Past Medical History:  Diagnosis Date  . Benign hypertension 05/22/2017  . 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 History of vertigo 05/22/2017  . Hyperlipidemia, unspecified 05/22/2017  . Lipoma of neck 05/22/2017   Overview:  right    Surgical History: Past Surgical History:  Procedure Laterality Date  . LIPOMA EXCISION      Home Medications:  Allergies as of 08/12/2017   No Known Allergies     Medication List        Accurate as of 08/12/17  3:27 PM. Always use your most recent med list.          amLODipine 5 MG tablet Commonly known as:  NORVASC Take 1 tablet by mouth daily.   atorvastatin 40 MG tablet Commonly known as:  LIPITOR Take 1 tablet by mouth daily.   enalapril 20 MG tablet Commonly known as:  VASOTEC Take 1 tablet by mouth daily.   metoprolol tartrate 50 MG tablet Commonly known as:  LOPRESSOR Take 1 tablet by mouth daily.       Allergies: No Known Allergies  Family History: Family History  Problem Relation Age of Onset  . Heart attack Father     Social History:  reports that  has never smoked. he has never used smokeless tobacco. His alcohol and drug histories are not on file.  ROS: UROLOGY Frequent Urination?: No Hard to postpone urination?: No Burning/pain with urination?: No Get up at night to urinate?: No Leakage of urine?: No Urine stream starts and stops?: No Trouble starting stream?: No Do you have to strain to urinate?: No Blood in urine?: No Urinary tract infection?: No Sexually transmitted disease?: No Injury to kidneys or bladder?: No Painful intercourse?: No Weak stream?: No Erection problems?: No Penile pain?: No  Gastrointestinal Nausea?: No Vomiting?: No Indigestion/heartburn?: No Diarrhea?: No Constipation?: No  Constitutional Fever: No Night sweats?: No Weight loss?: No Fatigue?: No  Skin Skin rash/lesions?: No Itching?: No  Eyes Blurred vision?: No Double vision?: No  Ears/Nose/Throat Sore throat?: No Sinus problems?:  No  Hematologic/Lymphatic Swollen glands?: No Easy bruising?: No  Cardiovascular Leg swelling?: No Chest pain?: No  Respiratory Cough?: No Shortness of breath?: No  Endocrine Excessive thirst?: No  Musculoskeletal Back pain?: No Joint pain?: No  Neurological Headaches?: No Dizziness?: No  Psychologic Depression?: No Anxiety?: No  Physical Exam: BP (!) 170/95   Pulse 73   Ht 5\' 5"  (1.651 m)   Wt 160 lb (72.6 kg)   BMI 26.63 kg/m   Constitutional:   Alert and oriented, No acute distress. Accompanied by wife today. Declines an interpreter.   HEENT: [place] AT, moist mucus membranes.  Trachea midline, no masses. Cardiovascular: No clubbing, cyanosis, or edema. Respiratory: Normal respiratory effort, no increased work of breathing. Skin: No rashes, bruises or suspicious lesions. Neurologic: Grossly intact, no focal deficits, moving all 4 extremities. Psychiatric: Normal mood and affect.  Laboratory Data: Lab Results  Component Value Date   WBC 16.0 (H) 04/24/2013   HGB 13.3 04/25/2013   HCT 43.0 04/24/2013   MCV 90 04/24/2013   PLT 200 04/24/2013    Lab Results  Component Value Date   CREATININE 1.00 07/05/2017    Lab Results  Component Value Date   PSA1 5.3 (H) 05/17/2017    Pertinent Imaging: CLINICAL DATA:  Newly diagnosed high-grade prostate carcinoma. Staging.  EXAM: CT ABDOMEN AND PELVIS WITH CONTRAST  TECHNIQUE: Multidetector CT imaging of the abdomen and pelvis was performed using the standard protocol following bolus administration of intravenous contrast.  CONTRAST:  183mL ISOVUE-300 IOPAMIDOL (ISOVUE-300) INJECTION 61%  COMPARISON:  None.  FINDINGS: Lower Chest: No acute findings.  Hepatobiliary: No hepatic masses identified. Tiny sub-cm cyst noted in posterior right hepatic lobe. 1 cm calcified gallstone is seen, however there is no evidence of cholecystitis or biliary ductal dilatation.  Pancreas:  No mass or inflammatory changes.  Spleen: Within normal limits in size and appearance.  Adrenals/Urinary Tract: No masses identified. Small subcapsular cyst noted in lower pole of left kidney. No evidence of hydronephrosis.  Stomach/Bowel: No evidence of obstruction, inflammatory process or abnormal fluid collections. Normal appendix visualized.  Vascular/Lymphatic: No pathologically enlarged lymph nodes. No abdominal aortic aneurysm. Aortic atherosclerosis.  Reproductive:  No mass or  other significant abnormality.  Other:  None.  Musculoskeletal:  No suspicious bone lesions identified.  IMPRESSION: No evidence of abdominal or pelvic metastatic disease.  Cholelithiasis.  No radiographic evidence of cholecystitis.  Aortic atherosclerosis.   Electronically Signed   By: Earle Gell M.D.   On: 07/06/2017 10:13  score 8 or greater.  EXAM: NUCLEAR MEDICINE WHOLE BODY BONE SCAN  TECHNIQUE: Whole body anterior and posterior images were obtained approximately 3 hours after intravenous injection of radiopharmaceutical.  RADIOPHARMACEUTICALS:  23.0 mCi Technetium-81m MDP IV  COMPARISON:  Abdominopelvic CT of 07/05/2017  FINDINGS: Scattered areas of degenerative uptake, including about the knees and first metatarsal phalangeal joints bilaterally. Right mandibular uptake may be due to dental infection/inflammation. Expected tracer within the renal collecting systems and urinary bladder.  IMPRESSION: No findings to suggest osseous metastasis.   Electronically Signed   By: Abigail Miyamoto M.D.   On: 08/03/2017 14:22  CT scan and bone scan personally reviewed.  Assessment & Plan:    1. Prostate cancer ([place]) High risk Gleason 4+4 prostate cancer, high volume.  Staging without evidence of metastatic disease.  MSK nomogram was reviewed with the patient, high risk of locally advanced disease, seminal vesicle and lymph nodes as above.  The patient was counseled about the natural  history of prostate cancer and the standard treatment options that are available for prostate cancer. It was explained to him how his age and life expectancy, clinical stage, Gleason score, and PSA affect his prognosis, the decision to proceed with additional staging studies, as well as how that information influences recommended treatment strategies. We discussed the roles for active surveillance, radiation therapy, surgical therapy, androgen deprivation, as well as ablative  therapy options for the treatment of prostate cancer as appropriate to his individual cancer situation. We discussed the risks and benefits of these options with regard to their impact on cancer control and also in terms of potential adverse events, complications, and impact on quality of life particularly related to urinary, bowel, and sexual function. The patient was encouraged to ask questions throughout the discussion today and all questions were answered to his stated satisfaction. In addition, the patient was providedwith and/or directed to appropriate resources and literature for further education about prostate cancer treatment options.  He is a fairly decent surgical candidate.  We did review robotic prostatectomy at length today.  If he does desire to proceed with this, he understands that he will most likely adjuvant or salvage radiation pending the surgical pathology.  After discussion today, he is most interested in radiation treatment.  I would recommend adjuvant Lupron, 2-3 years.  This will be initiated at the time of radiation.  We did also review the risk and side effects of ADT including risk of hot flashes, weight gain, loss of muscle mass, cardiac risks, amongst others.  Discussed importance of bone health on ADT, recommend 1000-1200 mg daily calcium suppliment and [phone] IU vit D daily.  Also encouraged weight being exercises and cardiovascular health.  Discussion today was lengthy.  All questions answered in detail.   - Ambulatory referral to Radiation Oncology    Return in about 6 months (around 02/09/2018) for recheck prostate cancer.  Hollice Espy, MD  [facility] [address] [place], [place] [address] [phone]  I spent 40 min with this patient of which greater than 50% was spent in counseling and coordination of care with the patient.

## 2017-08-23 ENCOUNTER — Ambulatory Visit: Payer: BLUE CROSS/BLUE SHIELD | Admitting: Urology

## 2017-08-24 ENCOUNTER — Ambulatory Visit: Payer: BLUE CROSS/BLUE SHIELD | Admitting: Radiation Oncology

## 2017-08-31 ENCOUNTER — Encounter: Payer: Self-pay | Admitting: Radiation Oncology

## 2017-08-31 ENCOUNTER — Other Ambulatory Visit: Payer: Self-pay

## 2017-08-31 ENCOUNTER — Ambulatory Visit
Admission: RE | Admit: 2017-08-31 | Discharge: 2017-08-31 | Disposition: A | Discharge status: Home / Self Care | Payer: BLUE CROSS/BLUE SHIELD | Source: Ambulatory Visit | Attending: Radiation Oncology | Admitting: Radiation Oncology

## 2017-08-31 VITALS — BP 143/85 | HR 70 | Temp 96.8°F | Resp 18 | Wt 162.9 lb

## 2017-08-31 DIAGNOSIS — E785 Hyperlipidemia, unspecified: Secondary | ICD-10-CM | POA: Insufficient documentation

## 2017-08-31 DIAGNOSIS — C61 Malignant neoplasm of prostate: Secondary | ICD-10-CM | POA: Insufficient documentation

## 2017-08-31 DIAGNOSIS — Z79899 Other long term (current) drug therapy: Secondary | ICD-10-CM | POA: Diagnosis not present

## 2017-08-31 DIAGNOSIS — D179 Benign lipomatous neoplasm, unspecified: Secondary | ICD-10-CM | POA: Diagnosis not present

## 2017-08-31 DIAGNOSIS — I10 Essential (primary) hypertension: Secondary | ICD-10-CM | POA: Diagnosis not present

## 2017-08-31 DIAGNOSIS — Z510 Encounter for antineoplastic radiation therapy: Secondary | ICD-10-CM | POA: Diagnosis not present

## 2017-08-31 NOTE — Consult Note (Signed)
NEW PATIENT EVALUATION  Name: [name]  MRN: [id number]  Date:   08/31/2017     DOB: [birth year]   This 67 y.o. 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  REFERRING PHYSICIAN: Idelle Crouch, MD  CHIEF COMPLAINT:  Chief Complaint  Patient presents with  . Prostate Cancer    Pt is here for initial consultation of radiation therapy for prostate cancer.     DIAGNOSIS: The encounter diagnosis was Malignant neoplasm of prostate ([place]).   PREVIOUS INVESTIGATIONS:  Pathology reports reviewed Clinical notes reviewed Bone scan and CT scan reviewed   HPI: atient is a 67 year old male presented with an elevated PSA of 5.6 which have been slowly rising over the past several years. His prostate exam was unremarkable. He had a rocks me 44 cc prostate volume. He underwent transrectal ultrasound-guided core biopsy showing 9 of 12 cores positive for Gleason 8 (4+4) adenocarcinoma. There is also some scattered Gleason 6 and 7 cores.patient underwent both bone scan and CT scan of abdomen and pelvis showing no evidence to suggest locally advanced disease or metastatic disease. He has very little urinary symptoms. No urgency frequency or nocturia.treatment options have been discussed with urology patient is favoring radiation therapy treatment.  PLANNED TREATMENT REGIMEN: Lupron therapy along with external beam radiation therapy to prostate and pelvic nodes as well as I-125 interstitial implant boost  PAST MEDICAL HISTORY:  has a past medical history of Benign hypertension (05/22/2017), Brain stem stroke syndrome (05/22/2017), History of vertigo (05/22/2017), Hyperlipidemia, unspecified (05/22/2017), and Lipoma of neck (05/22/2017).    PAST SURGICAL HISTORY:  Past Surgical History:  Procedure Laterality Date  . LIPOMA EXCISION      FAMILY HISTORY: family history includes Heart attack in his father.  SOCIAL HISTORY:   reports that  has never smoked. he has never used smokeless tobacco.  ALLERGIES: Patient has no known allergies.  MEDICATIONS:  Current Outpatient Medications  Medication Sig Dispense Refill  . amLODipine (NORVASC) 5 MG tablet Take 1 tablet by mouth daily.    Marland Kitchen atorvastatin (LIPITOR) 40 MG tablet Take 1 tablet by mouth daily.    . enalapril (VASOTEC) 20 MG tablet Take 1 tablet by mouth daily.    . metoprolol tartrate (LOPRESSOR) 50 MG tablet Take 1 tablet by mouth daily.     No current facility-administered medications for this encounter.     ECOG PERFORMANCE STATUS:  0 - Asymptomatic  REVIEW OF SYSTEMS:  Patient denies any weight loss, fatigue, weakness, fever, chills or night sweats. Patient denies any loss of vision, blurred vision. Patient denies any ringing  of the ears or hearing loss. No irregular heartbeat. Patient denies heart murmur or history of fainting. Patient denies any chest pain or pain radiating to her upper extremities. Patient denies any shortness of breath, difficulty breathing at night, cough or hemoptysis. Patient denies any swelling in the lower legs. Patient denies any nausea vomiting, vomiting of blood, or coffee ground material in the vomitus. Patient denies any stomach pain. Patient states has had normal bowel movements no significant constipation or diarrhea. Patient denies any dysuria, hematuria or significant nocturia. Patient denies any problems walking, swelling in the joints or loss of balance. Patient denies any skin changes, loss of hair or loss of weight. Patient denies any excessive worrying or anxiety or significant depression. Patient denies any problems with insomnia. Patient denies excessive thirst, polyuria, polydipsia. Patient denies any swollen glands, patient denies  easy bruising or easy bleeding. Patient denies any recent infections, allergies or URI. Patient "s visual fields have not changed significantly in recent time.    PHYSICAL EXAM: BP (!)  143/85   Pulse 70   Temp (!) 96.8 F (36 C)   Resp 18   Wt 162 lb 14.7 oz (73.9 kg)   BMI 27.11 kg/m  On rectal exam rectal sphincter tone is good prostate is smooth without evidence of nodularity or mass. Sulcus is preserved bilaterally. No other rectal abnormality is identified. Well-developed well-nourished patient in NAD. HEENT reveals PERLA, EOMI, discs not visualized.  Oral cavity is clear. No oral mucosal lesions are identified. Neck is clear without evidence of cervical or supraclavicular adenopathy. Lungs are clear to A&P. Cardiac examination is essentially unremarkable with regular rate and rhythm without murmur rub or thrill. Abdomen is benign with no organomegaly or masses noted. Motor sensory and DTR levels are equal and symmetric in the upper and lower extremities. Cranial nerves II through XII are grossly intact. Proprioception is intact. No peripheral adenopathy or edema is identified. No motor or sensory levels are noted. Crude visual fields are within normal range.  LABORATORY DATA: pathology reports reviewed    RADIOLOGY RESULTS:bone scan and CT scans reviewed and compatible with the above-stated findings   IMPRESSION: stage IIB adenocarcinoma the prostate in 67 year old male  PLAN: at this time like to go ahead with external beam radiation therapy to both his prostate and pelvic nodes. I have reviewed treatment options including surgery radiation therapy. Patient and wife favor radiation therapy after complete explanation by urology. I would treat his prostateto 5000 cGy with his lymph nodes receiving 4800 cGy using external beam radiation therapy I am RT treatment planning and delivery Risks and benefits of treatment including increased lower urinary tract symptoms, diarrhea fatigue alteration of blood counts skin reaction all were described in detail. We will also start planning his I-125 interstitial implant for boost with his volume study towards the completion of the of  his external beam radiation. I also favor Lupron therapy for at least a year and a half to 2 years. I first set up and ordered CT simulation for later this week. Patient and wife both seem to comprehend my treatment plan well.  I would like to take this opportunity to thank you for allowing me to participate in the care of your patient.Noreene Filbert, MD

## 2017-09-01 ENCOUNTER — Ambulatory Visit
Admission: RE | Admit: 2017-09-01 | Discharge: 2017-09-01 | Disposition: A | Discharge status: Home / Self Care | Payer: BLUE CROSS/BLUE SHIELD | Source: Ambulatory Visit | Attending: Radiation Oncology | Admitting: Radiation Oncology

## 2017-09-01 DIAGNOSIS — C61 Malignant neoplasm of prostate: Secondary | ICD-10-CM | POA: Diagnosis not present

## 2017-09-02 ENCOUNTER — Other Ambulatory Visit: Payer: Self-pay | Admitting: Radiology

## 2017-09-02 DIAGNOSIS — C61 Malignant neoplasm of prostate: Secondary | ICD-10-CM

## 2017-09-07 DIAGNOSIS — C61 Malignant neoplasm of prostate: Secondary | ICD-10-CM | POA: Diagnosis not present

## 2017-09-09 ENCOUNTER — Other Ambulatory Visit: Payer: Self-pay | Admitting: *Deleted

## 2017-09-09 DIAGNOSIS — C61 Malignant neoplasm of prostate: Secondary | ICD-10-CM

## 2017-09-12 ENCOUNTER — Ambulatory Visit
Admission: RE | Admit: 2017-09-12 | Discharge: 2017-09-12 | Disposition: A | Discharge status: Home / Self Care | Payer: BLUE CROSS/BLUE SHIELD | Source: Ambulatory Visit | Attending: Radiation Oncology | Admitting: Radiation Oncology

## 2017-09-13 ENCOUNTER — Inpatient Hospital Stay
Admission: RE | Admit: 2017-09-13 | Discharge: 2017-09-13 | Disposition: A | Discharge status: Home / Self Care | Payer: BLUE CROSS/BLUE SHIELD | Source: Ambulatory Visit | Attending: Radiation Oncology | Admitting: Radiation Oncology

## 2017-09-13 ENCOUNTER — Ambulatory Visit: Payer: BLUE CROSS/BLUE SHIELD

## 2017-09-13 DIAGNOSIS — C61 Malignant neoplasm of prostate: Secondary | ICD-10-CM | POA: Diagnosis not present

## 2017-09-14 ENCOUNTER — Ambulatory Visit
Admission: RE | Admit: 2017-09-14 | Discharge: 2017-09-14 | Disposition: A | Discharge status: Home / Self Care | Payer: BLUE CROSS/BLUE SHIELD | Source: Ambulatory Visit | Attending: Radiation Oncology | Admitting: Radiation Oncology

## 2017-09-14 ENCOUNTER — Ambulatory Visit: Payer: BLUE CROSS/BLUE SHIELD

## 2017-09-14 DIAGNOSIS — C61 Malignant neoplasm of prostate: Secondary | ICD-10-CM | POA: Diagnosis not present

## 2017-09-15 ENCOUNTER — Ambulatory Visit: Payer: BLUE CROSS/BLUE SHIELD

## 2017-09-15 ENCOUNTER — Ambulatory Visit
Admission: RE | Admit: 2017-09-15 | Discharge: 2017-09-15 | Disposition: A | Discharge status: Home / Self Care | Payer: BLUE CROSS/BLUE SHIELD | Source: Ambulatory Visit | Attending: Radiation Oncology | Admitting: Radiation Oncology

## 2017-09-15 DIAGNOSIS — C61 Malignant neoplasm of prostate: Secondary | ICD-10-CM | POA: Diagnosis not present

## 2017-09-16 ENCOUNTER — Ambulatory Visit: Payer: BLUE CROSS/BLUE SHIELD

## 2017-09-16 ENCOUNTER — Ambulatory Visit
Admission: RE | Admit: 2017-09-16 | Discharge: 2017-09-16 | Disposition: A | Discharge status: Home / Self Care | Payer: BLUE CROSS/BLUE SHIELD | Source: Ambulatory Visit | Attending: Radiation Oncology | Admitting: Radiation Oncology

## 2017-09-16 DIAGNOSIS — C61 Malignant neoplasm of prostate: Secondary | ICD-10-CM | POA: Diagnosis not present

## 2017-09-19 ENCOUNTER — Ambulatory Visit: Payer: BLUE CROSS/BLUE SHIELD

## 2017-09-19 ENCOUNTER — Inpatient Hospital Stay
Admission: RE | Admit: 2017-09-19 | Discharge: 2017-09-19 | Disposition: A | Discharge status: Home / Self Care | Payer: Self-pay | Source: Ambulatory Visit | Attending: Radiation Oncology | Admitting: Radiation Oncology

## 2017-09-19 DIAGNOSIS — C61 Malignant neoplasm of prostate: Secondary | ICD-10-CM | POA: Diagnosis not present

## 2017-09-20 ENCOUNTER — Ambulatory Visit
Admission: RE | Admit: 2017-09-20 | Discharge: 2017-09-20 | Disposition: A | Discharge status: Home / Self Care | Payer: BLUE CROSS/BLUE SHIELD | Source: Ambulatory Visit | Attending: Radiation Oncology | Admitting: Radiation Oncology

## 2017-09-20 ENCOUNTER — Ambulatory Visit: Payer: BLUE CROSS/BLUE SHIELD

## 2017-09-20 DIAGNOSIS — C61 Malignant neoplasm of prostate: Secondary | ICD-10-CM | POA: Diagnosis not present

## 2017-09-21 ENCOUNTER — Ambulatory Visit: Payer: BLUE CROSS/BLUE SHIELD

## 2017-09-21 ENCOUNTER — Inpatient Hospital Stay: Payer: BLUE CROSS/BLUE SHIELD | Attending: Radiation Oncology

## 2017-09-21 ENCOUNTER — Ambulatory Visit
Admission: RE | Admit: 2017-09-21 | Discharge: 2017-09-21 | Disposition: A | Discharge status: Home / Self Care | Payer: BLUE CROSS/BLUE SHIELD | Source: Ambulatory Visit | Attending: Radiation Oncology | Admitting: Radiation Oncology

## 2017-09-21 DIAGNOSIS — C61 Malignant neoplasm of prostate: Secondary | ICD-10-CM | POA: Diagnosis not present

## 2017-09-22 ENCOUNTER — Other Ambulatory Visit: Payer: BLUE CROSS/BLUE SHIELD

## 2017-09-22 ENCOUNTER — Ambulatory Visit: Payer: BLUE CROSS/BLUE SHIELD

## 2017-09-22 ENCOUNTER — Ambulatory Visit
Admission: RE | Admit: 2017-09-22 | Discharge: 2017-09-22 | Disposition: A | Discharge status: Home / Self Care | Payer: BLUE CROSS/BLUE SHIELD | Source: Ambulatory Visit | Attending: Radiation Oncology | Admitting: Radiation Oncology

## 2017-09-22 DIAGNOSIS — C61 Malignant neoplasm of prostate: Secondary | ICD-10-CM | POA: Diagnosis not present

## 2017-09-23 ENCOUNTER — Ambulatory Visit: Payer: BLUE CROSS/BLUE SHIELD

## 2017-09-23 ENCOUNTER — Ambulatory Visit
Admission: RE | Admit: 2017-09-23 | Discharge: 2017-09-23 | Disposition: A | Discharge status: Home / Self Care | Payer: BLUE CROSS/BLUE SHIELD | Source: Ambulatory Visit | Attending: Radiation Oncology | Admitting: Radiation Oncology

## 2017-09-23 DIAGNOSIS — C61 Malignant neoplasm of prostate: Secondary | ICD-10-CM | POA: Diagnosis not present

## 2017-09-26 ENCOUNTER — Ambulatory Visit: Payer: BLUE CROSS/BLUE SHIELD

## 2017-09-26 ENCOUNTER — Ambulatory Visit
Admission: RE | Admit: 2017-09-26 | Discharge: 2017-09-26 | Disposition: A | Discharge status: Home / Self Care | Payer: BLUE CROSS/BLUE SHIELD | Source: Ambulatory Visit | Attending: Radiation Oncology | Admitting: Radiation Oncology

## 2017-09-26 DIAGNOSIS — C61 Malignant neoplasm of prostate: Secondary | ICD-10-CM | POA: Diagnosis not present

## 2017-09-27 ENCOUNTER — Ambulatory Visit: Payer: BLUE CROSS/BLUE SHIELD

## 2017-09-27 ENCOUNTER — Ambulatory Visit
Admission: RE | Admit: 2017-09-27 | Discharge: 2017-09-27 | Disposition: A | Discharge status: Home / Self Care | Payer: BLUE CROSS/BLUE SHIELD | Source: Ambulatory Visit | Attending: Radiation Oncology | Admitting: Radiation Oncology

## 2017-09-27 DIAGNOSIS — C61 Malignant neoplasm of prostate: Secondary | ICD-10-CM | POA: Diagnosis not present

## 2017-09-28 ENCOUNTER — Ambulatory Visit
Admission: RE | Admit: 2017-09-28 | Discharge: 2017-09-28 | Disposition: A | Discharge status: Home / Self Care | Payer: BLUE CROSS/BLUE SHIELD | Source: Ambulatory Visit | Attending: Radiation Oncology | Admitting: Radiation Oncology

## 2017-09-28 ENCOUNTER — Ambulatory Visit: Payer: BLUE CROSS/BLUE SHIELD

## 2017-09-28 DIAGNOSIS — C61 Malignant neoplasm of prostate: Secondary | ICD-10-CM | POA: Diagnosis not present

## 2017-09-29 ENCOUNTER — Ambulatory Visit
Admission: RE | Admit: 2017-09-29 | Discharge: 2017-09-29 | Disposition: A | Discharge status: Home / Self Care | Payer: BLUE CROSS/BLUE SHIELD | Source: Ambulatory Visit | Attending: Radiation Oncology | Admitting: Radiation Oncology

## 2017-09-29 ENCOUNTER — Ambulatory Visit: Payer: BLUE CROSS/BLUE SHIELD

## 2017-09-29 DIAGNOSIS — C61 Malignant neoplasm of prostate: Secondary | ICD-10-CM | POA: Diagnosis not present

## 2017-09-30 ENCOUNTER — Ambulatory Visit: Payer: BLUE CROSS/BLUE SHIELD

## 2017-09-30 ENCOUNTER — Ambulatory Visit
Admission: RE | Admit: 2017-09-30 | Discharge: 2017-09-30 | Disposition: A | Discharge status: Home / Self Care | Payer: BLUE CROSS/BLUE SHIELD | Source: Ambulatory Visit | Attending: Radiation Oncology | Admitting: Radiation Oncology

## 2017-09-30 DIAGNOSIS — C61 Malignant neoplasm of prostate: Secondary | ICD-10-CM | POA: Diagnosis not present

## 2017-10-03 ENCOUNTER — Ambulatory Visit: Payer: BLUE CROSS/BLUE SHIELD

## 2017-10-03 ENCOUNTER — Ambulatory Visit
Admission: RE | Admit: 2017-10-03 | Discharge: 2017-10-03 | Disposition: A | Discharge status: Home / Self Care | Payer: BLUE CROSS/BLUE SHIELD | Source: Ambulatory Visit | Attending: Radiation Oncology | Admitting: Radiation Oncology

## 2017-10-03 DIAGNOSIS — C61 Malignant neoplasm of prostate: Secondary | ICD-10-CM | POA: Diagnosis not present

## 2017-10-04 ENCOUNTER — Ambulatory Visit
Admission: RE | Admit: 2017-10-04 | Discharge: 2017-10-04 | Disposition: A | Discharge status: Home / Self Care | Payer: BLUE CROSS/BLUE SHIELD | Source: Ambulatory Visit | Attending: Radiation Oncology | Admitting: Radiation Oncology

## 2017-10-04 ENCOUNTER — Ambulatory Visit: Payer: BLUE CROSS/BLUE SHIELD

## 2017-10-04 DIAGNOSIS — C61 Malignant neoplasm of prostate: Secondary | ICD-10-CM | POA: Diagnosis not present

## 2017-10-05 ENCOUNTER — Ambulatory Visit: Payer: BLUE CROSS/BLUE SHIELD

## 2017-10-05 ENCOUNTER — Inpatient Hospital Stay: Payer: BLUE CROSS/BLUE SHIELD | Attending: Radiation Oncology

## 2017-10-05 ENCOUNTER — Ambulatory Visit
Admission: RE | Admit: 2017-10-05 | Discharge: 2017-10-05 | Disposition: A | Discharge status: Home / Self Care | Payer: BLUE CROSS/BLUE SHIELD | Source: Ambulatory Visit | Attending: Radiation Oncology | Admitting: Radiation Oncology

## 2017-10-05 DIAGNOSIS — C61 Malignant neoplasm of prostate: Secondary | ICD-10-CM | POA: Diagnosis not present

## 2017-10-06 ENCOUNTER — Ambulatory Visit: Payer: BLUE CROSS/BLUE SHIELD

## 2017-10-06 ENCOUNTER — Ambulatory Visit
Admission: RE | Admit: 2017-10-06 | Discharge: 2017-10-06 | Disposition: A | Discharge status: Home / Self Care | Payer: BLUE CROSS/BLUE SHIELD | Source: Ambulatory Visit | Attending: Radiation Oncology | Admitting: Radiation Oncology

## 2017-10-06 DIAGNOSIS — C61 Malignant neoplasm of prostate: Secondary | ICD-10-CM | POA: Diagnosis not present

## 2017-10-07 ENCOUNTER — Ambulatory Visit
Admission: RE | Admit: 2017-10-07 | Discharge: 2017-10-07 | Disposition: A | Discharge status: Home / Self Care | Payer: BLUE CROSS/BLUE SHIELD | Source: Ambulatory Visit | Attending: Radiation Oncology | Admitting: Radiation Oncology

## 2017-10-07 ENCOUNTER — Ambulatory Visit: Payer: BLUE CROSS/BLUE SHIELD

## 2017-10-07 DIAGNOSIS — C61 Malignant neoplasm of prostate: Secondary | ICD-10-CM | POA: Diagnosis not present

## 2017-10-10 ENCOUNTER — Ambulatory Visit
Admission: RE | Admit: 2017-10-10 | Discharge: 2017-10-10 | Disposition: A | Discharge status: Home / Self Care | Payer: BLUE CROSS/BLUE SHIELD | Source: Ambulatory Visit | Attending: Radiation Oncology | Admitting: Radiation Oncology

## 2017-10-10 ENCOUNTER — Ambulatory Visit: Payer: BLUE CROSS/BLUE SHIELD

## 2017-10-10 ENCOUNTER — Encounter: Payer: Self-pay | Admitting: Radiation Oncology

## 2017-10-10 ENCOUNTER — Other Ambulatory Visit: Payer: Self-pay

## 2017-10-10 VITALS — BP 190/107 | HR 83 | Temp 97.9°F | Resp 18 | Wt 163.6 lb

## 2017-10-10 DIAGNOSIS — C61 Malignant neoplasm of prostate: Secondary | ICD-10-CM | POA: Diagnosis not present

## 2017-10-10 DIAGNOSIS — Z8673 Personal history of transient ischemic attack (TIA), and cerebral infarction without residual deficits: Secondary | ICD-10-CM | POA: Insufficient documentation

## 2017-10-10 DIAGNOSIS — I10 Essential (primary) hypertension: Secondary | ICD-10-CM | POA: Insufficient documentation

## 2017-10-10 DIAGNOSIS — E785 Hyperlipidemia, unspecified: Secondary | ICD-10-CM | POA: Insufficient documentation

## 2017-10-10 DIAGNOSIS — Z79899 Other long term (current) drug therapy: Secondary | ICD-10-CM | POA: Insufficient documentation

## 2017-10-10 DIAGNOSIS — Z792 Long term (current) use of antibiotics: Secondary | ICD-10-CM | POA: Insufficient documentation

## 2017-10-10 SURGERY — ULTRASOUND, PROSTATE, FOR VOLUME DETERMINATION
Anesthesia: Choice

## 2017-10-10 NOTE — Progress Notes (Signed)
Radiation Oncology I-125 interstitial implant volume study note  Name: [name]   Date:   09/02/2017 MRN:  [id number] DOB: 11-Nov-1950    This 67 y.o. male presents to the clinic today for Patient is a 67 year old male currently under external beam radiation therapy for stage II adenocarcinoma the prostate Gleason score of 8 (4+4) in 9 of 12 cores. His initial PSA was 5.6. He's been undergoing external beam radiation therapy to both his prostate and pelvic nodes is now seen for volume study in anticipation of a I-125 interstitial implant for boost. He continues to do well very little side effects or complaints. Specifically denies diarrhea dysuria or any other GI/GU complaints. He is seen today for volume study.  Marland Kitchen  REFERRING PROVIDER: No ref. provider found  HPI: As above.  COMPLICATIONS OF TREATMENT: none  FOLLOW UP COMPLIANCE: keeps appointments   PHYSICAL EXAM:  There were no vitals taken for this visit. Well-developed well-nourished patient in NAD. HEENT reveals PERLA, EOMI, discs not visualized.  Oral cavity is clear. No oral mucosal lesions are identified. Neck is clear without evidence of cervical or supraclavicular adenopathy. Lungs are clear to A&P. Cardiac examination is essentially unremarkable with regular rate and rhythm without murmur rub or thrill. Abdomen is benign with no organomegaly or masses noted. Motor sensory and DTR levels are equal and symmetric in the upper and lower extremities. Cranial nerves II through XII are grossly intact. Proprioception is intact. No peripheral adenopathy or edema is identified. No motor or sensory levels are noted. Crude visual fields are within normal range.  RADIOLOGY RESULTS: Ultrasound used for planning of I-125 interstitial implant  PLAN: Patient was taken to the cystoscopy suite in the OR. Patient was placed in the low lithotomy position. Foley catheter was placed. Trans-rectal ultrasound probe was inserted into the rectum and prostate  seminal vesicles were visualized as well as bladder base. stepping images were performed on a 5 mm increments. Images will be placed in BrachyVision treatment planning system to determine seed placement coordinates for eventual I-125 interstitial implant. Images will be reviewed with the physics and dosimetry staff for final quality approval. I personally was present for the volume study and assisted in delineation of contour volumes.  At the end of the procedure Foley catheter was removed, rectal ultrasound probe was removed. Patient tolerated his procedures extremely well with no side effects or complaints. Patient has given appointment for interstitial implant date. Consent was signed today as well as history and physical performed in preparation for his outpatient surgical implant.      Noreene Filbert, MD

## 2017-10-10 NOTE — Progress Notes (Signed)
History and physical EVALUATION  Name: [name]  MRN: [id number]  Date:   10/10/2017     DOB: Dec 20, 1950   This 67 y.o. male patient presents to the clinic for history and physical prior to I-125 interstitial implant for adenocarcinoma the prostate  REFERRING PHYSICIAN: Idelle Crouch, MD  CHIEF COMPLAINT:  Chief Complaint  Patient presents with  . Prostate Cancer    post volume study    DIAGNOSIS: The encounter diagnosis was Malignant neoplasm of prostate ([place]).   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 His initial PSA was 5.6. He's been undergoing external beam radiation therapy to both his prostate and pelvic nodes is now seen for volume study in anticipation of a I-125 interstitial implant for boost. He continues to do well very little side effects or complaints. Specifically denies diarrhea dysuria or any other GI/GU complaints.  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    PAST SURGICAL HISTORY:  Past Surgical History:  Procedure Laterality Date  . LIPOMA EXCISION      FAMILY HISTORY: family history includes Heart attack in his father.  SOCIAL HISTORY:  reports that  has never smoked. he has never used smokeless tobacco.  ALLERGIES: Patient has no known allergies.  MEDICATIONS:  Current Outpatient Medications  Medication Sig Dispense Refill  . amLODipine (NORVASC) 5 MG tablet Take 5 mg by mouth daily.     Marland Kitchen aspirin EC 81 MG tablet Take 81 mg by mouth daily.    Marland Kitchen atorvastatin (LIPITOR) 40 MG tablet Take 40 mg by mouth daily.     .  Cholecalciferol (VITAMIN D3 PO) Take 1 capsule by mouth daily.    . enalapril (VASOTEC) 20 MG tablet Take 20 mg by mouth daily.     . metoprolol tartrate (LOPRESSOR) 50 MG tablet Take 50 mg by mouth daily.      No current facility-administered medications for this encounter.     ECOG PERFORMANCE STATUS:  0 - Asymptomatic  REVIEW OF SYSTEMS:  Patient denies any weight loss, fatigue, weakness, fever, chills or night sweats. Patient denies any loss of vision, blurred vision. Patient denies any ringing  of the ears or hearing loss. No irregular heartbeat. Patient denies heart murmur or history of fainting. Patient denies any chest pain or pain radiating to her upper extremities. Patient denies any shortness of breath, difficulty breathing at night, cough or hemoptysis. Patient denies any swelling in the lower legs. Patient denies any nausea vomiting, vomiting of blood, or coffee ground material in the vomitus. Patient denies any stomach pain. Patient states has had normal bowel movements no significant constipation or diarrhea. Patient denies any dysuria, hematuria or significant nocturia. Patient denies any problems walking, swelling in the joints or loss of balance. Patient denies any skin changes, loss of hair or loss of weight. Patient denies any excessive worrying or anxiety or significant depression. Patient denies any problems with insomnia. Patient denies excessive thirst, polyuria, polydipsia. Patient denies any swollen glands, patient denies easy bruising or easy bleeding. Patient denies any recent infections, allergies or URI. Patient "s visual fields have not changed significantly in recent time.  PHYSICAL EXAM: BP (!) 190/107   Pulse 83   Temp 97.9 F (36.6 C)   Resp 18   Wt 163 lb 9.3 oz (74.2 kg)   BMI 27.22 kg/m  Well-developed well-nourished patient in NAD. HEENT reveals PERLA, EOMI, discs not visualized.  Oral cavity is clear. No oral mucosal lesions are identified. Neck is  clear without evidence of cervical or supraclavicular adenopathy. Lungs are clear to A&P. Cardiac examination is essentially unremarkable with regular rate and rhythm without murmur rub or thrill. Abdomen is benign with no organomegaly or masses noted. Motor sensory and DTR levels are equal and symmetric in the upper and lower extremities. Cranial nerves II through XII are grossly intact. Proprioception is intact. No peripheral adenopathy or edema is identified. No motor or sensory levels are noted. Crude visual fields are within normal range.  LABORATORY DATA: Pathology reports reviewed and compatible with the above-stated findings    RADIOLOGY RESULTS: Ultrasound used for prostate volume study   IMPRESSION: Stage IIB adenocarcinoma the prostate Gleason score of 51 in 67 year old male currently undergoing external beam radiation therapy to prostate and pelvic nodes.  PLAN: At this time I to go ahead with I-125 interstitial implant for boost. We will perform volume study for brachytherapy treatment planning. Risks and benefits of treatment including again increased urinary tract symptoms diarrhea fatigue and radiation safety precautions all reviewed with the patient and his wife. Patient is a" of unclear 3 and ancestry an interpreter was present for my history and physical. Patient knows to call prior to implant with any concerns. He continues external beam treatment.  I would like to take this opportunity to thank you for allowing me to participate in the care of your patient.Noreene Filbert, MD

## 2017-10-11 ENCOUNTER — Ambulatory Visit
Admission: RE | Admit: 2017-10-11 | Discharge: 2017-10-11 | Disposition: A | Discharge status: Home / Self Care | Payer: BLUE CROSS/BLUE SHIELD | Source: Ambulatory Visit | Attending: Radiation Oncology | Admitting: Radiation Oncology

## 2017-10-11 ENCOUNTER — Ambulatory Visit: Payer: BLUE CROSS/BLUE SHIELD

## 2017-10-11 DIAGNOSIS — C61 Malignant neoplasm of prostate: Secondary | ICD-10-CM | POA: Diagnosis not present

## 2017-10-12 ENCOUNTER — Ambulatory Visit: Payer: BLUE CROSS/BLUE SHIELD

## 2017-10-12 ENCOUNTER — Ambulatory Visit
Admission: RE | Admit: 2017-10-12 | Discharge: 2017-10-12 | Disposition: A | Discharge status: Home / Self Care | Payer: BLUE CROSS/BLUE SHIELD | Source: Ambulatory Visit | Attending: Radiation Oncology | Admitting: Radiation Oncology

## 2017-10-12 DIAGNOSIS — C61 Malignant neoplasm of prostate: Secondary | ICD-10-CM | POA: Diagnosis not present

## 2017-10-13 ENCOUNTER — Ambulatory Visit
Admission: RE | Admit: 2017-10-13 | Discharge: 2017-10-13 | Disposition: A | Discharge status: Home / Self Care | Payer: BLUE CROSS/BLUE SHIELD | Source: Ambulatory Visit | Attending: Radiation Oncology | Admitting: Radiation Oncology

## 2017-10-13 ENCOUNTER — Ambulatory Visit: Payer: BLUE CROSS/BLUE SHIELD

## 2017-10-13 DIAGNOSIS — C61 Malignant neoplasm of prostate: Secondary | ICD-10-CM | POA: Diagnosis not present

## 2017-10-14 ENCOUNTER — Ambulatory Visit
Admission: RE | Admit: 2017-10-14 | Discharge: 2017-10-14 | Disposition: A | Discharge status: Home / Self Care | Payer: BLUE CROSS/BLUE SHIELD | Source: Ambulatory Visit | Attending: Radiation Oncology | Admitting: Radiation Oncology

## 2017-10-14 ENCOUNTER — Ambulatory Visit: Payer: BLUE CROSS/BLUE SHIELD

## 2017-10-14 DIAGNOSIS — C61 Malignant neoplasm of prostate: Secondary | ICD-10-CM | POA: Diagnosis not present

## 2017-10-17 ENCOUNTER — Ambulatory Visit
Admission: RE | Admit: 2017-10-17 | Discharge: 2017-10-17 | Disposition: A | Discharge status: Home / Self Care | Payer: BLUE CROSS/BLUE SHIELD | Source: Ambulatory Visit | Attending: Radiation Oncology | Admitting: Radiation Oncology

## 2017-10-17 ENCOUNTER — Ambulatory Visit: Payer: BLUE CROSS/BLUE SHIELD

## 2017-10-17 DIAGNOSIS — C61 Malignant neoplasm of prostate: Secondary | ICD-10-CM | POA: Diagnosis not present

## 2017-10-18 ENCOUNTER — Ambulatory Visit
Admission: RE | Admit: 2017-10-18 | Discharge: 2017-10-18 | Disposition: A | Discharge status: Home / Self Care | Payer: BLUE CROSS/BLUE SHIELD | Source: Ambulatory Visit | Attending: Radiation Oncology | Admitting: Radiation Oncology

## 2017-10-18 DIAGNOSIS — C61 Malignant neoplasm of prostate: Secondary | ICD-10-CM | POA: Diagnosis not present

## 2017-11-07 ENCOUNTER — Other Ambulatory Visit: Payer: Self-pay

## 2017-11-07 ENCOUNTER — Encounter
Admission: RE | Admit: 2017-11-07 | Discharge: 2017-11-07 | Disposition: A | Discharge status: Home / Self Care | Payer: BLUE CROSS/BLUE SHIELD | Source: Ambulatory Visit | Attending: Urology | Admitting: Urology

## 2017-11-07 DIAGNOSIS — I10 Essential (primary) hypertension: Secondary | ICD-10-CM | POA: Diagnosis not present

## 2017-11-07 DIAGNOSIS — Z01812 Encounter for preprocedural laboratory examination: Secondary | ICD-10-CM | POA: Insufficient documentation

## 2017-11-07 DIAGNOSIS — Z01810 Encounter for preprocedural cardiovascular examination: Secondary | ICD-10-CM | POA: Diagnosis not present

## 2017-11-07 DIAGNOSIS — C61 Malignant neoplasm of prostate: Secondary | ICD-10-CM

## 2017-11-07 DIAGNOSIS — R9431 Abnormal electrocardiogram [ECG] [EKG]: Secondary | ICD-10-CM | POA: Diagnosis not present

## 2017-11-07 HISTORY — DX: Elevated prostate specific antigen (PSA): R97.20

## 2017-11-07 HISTORY — DX: Malignant (primary) neoplasm, unspecified: C80.1

## 2017-11-07 HISTORY — DX: Prediabetes: R73.03

## 2017-11-07 LAB — BASIC METABOLIC PANEL
ANION GAP: 6 (ref 5–15)
BUN: 19 mg/dL (ref 6–20)
CHLORIDE: 105 mmol/L (ref 101–111)
CO2: 28 mmol/L (ref 22–32)
Calcium: 8.7 mg/dL — ABNORMAL LOW (ref 8.9–10.3)
Creatinine, Ser: 0.74 mg/dL (ref 0.61–1.24)
GFR calc Af Amer: >60 mL/min (ref >60)
GFR calc non Af Amer: >60 mL/min (ref >60)
GLUCOSE: 156 mg/dL — AB (ref 65–99)
POTASSIUM: 3.5 mmol/L (ref 3.5–5.1)
Sodium: 139 mmol/L (ref 135–145)

## 2017-11-07 LAB — CBC
HEMATOCRIT: 43.6 % (ref 40.0–52.0)
Hemoglobin: 14.7 g/dL (ref 13.0–18.0)
MCH: 30.4 pg (ref 26.0–34.0)
MCHC: 33.7 g/dL (ref 32.0–36.0)
MCV: 90.1 fL (ref 80.0–100.0)
Platelets: 229 10*3/uL (ref 150–440)
RBC: 4.83 MIL/uL (ref 4.40–5.90)
RDW: 13.9 % (ref 11.5–14.5)
WBC: 5.7 10*3/uL (ref 3.8–10.6)

## 2017-11-07 MED ORDER — FLEET ENEMA 7-19 GM/118ML RE ENEM: 1.0000 | ENEMA | Freq: Once | RECTAL | Status: DC

## 2017-11-07 NOTE — Pre-Procedure Instructions (Addendum)
Medical history states patient has diabetes but he is unaware of this diagnosis.  Reviewing his history of HgbA1c, his numbers have been slowly rising.  Encouraged patient to speak to his medical doctor about this once his surgery is completed.  [place] interpreter, Sunmi was here during interview to interpret for patient and his wife.

## 2017-11-07 NOTE — Patient Instructions (Signed)
Your procedure is scheduled on: Monday, April 15TH  Report to [place]  To find out your arrival time please call [phone] between              1PM - 3PM on Friday, April 12TH  Remember: Instructions that are not followed completely may result in serious  medical risk, up to and including death, or upon the discretion of your surgeon  and anesthesiologist your surgery may need to be rescheduled.     _X__ 1. Do not eat food after midnight the night before your procedure.                 No gum chewing or hard candies.                   You may drink clear liquids up to 2 hours                 before you are scheduled to arrive for your surgery-                   DO not drink clear liquids within 2 hours of the start of your surgery.                  Clear Liquids include:  water, apple juice without pulp, clear carbohydrate                 drink such as Clearfast of Gatorade, Black Coffee or Tea (Do not add                 anything to coffee or tea).  __X__2.  On the morning of surgery brush your teeth with toothpaste and water,                     you may rinse your mouth with mouthwash if you wish.                           Do not swallow any toothpaste of mouthwash.     _X__ 3.  No Alcohol for 24 hours before or after surgery.   _X__ 4.  Do Not Smoke or use e-cigarettes For 24 Hours Prior to Your Surgery.                 Do not use any chewable tobacco products for at least 6 hours prior to                 surgery.  ____  5.  Bring all medications with you on the day of surgery if instructed.   ____  6.  Notify your doctor if there is any change in your medical condition      (cold, fever, infections).     Do not wear jewelry, make-up, hairpins, clips or nail polish. Do not wear lotions, powders, or perfumes. You may wear deodorant. Do not shave 48 hours prior to surgery. Men may shave face and neck. Do not bring  valuables to the hospital.    [facility] is not responsible for any belongings or valuables.  Contacts, dentures or bridgework may not be worn into surgery. Leave your suitcase in the car. After surgery it may be brought to your room. For patients admitted to the hospital, discharge time is determined by your treatment team.   Patients discharged the day of surgery will not  be allowed to drive home.   Please read over the following fact sheets that you were given:   PREPARING FOR SURGERY  ____ Take these medicines the morning of surgery with A SIP OF WATER:    1. AMLODIPINE  2. METOPROLOL  3.   4.  5.  6.  ____ Fleet Enema (as directed)   __X__ TAKE A SHOWER ON THE MORNING OF SURGERY  ____ Use inhalers on the day of surgery  _X___ Stop ASPIRIN TODAY!!  ____ Stop Anti-inflammatories TODAY!!   ____ Stop supplements until after surgery.    ____ Bring C-Pap to the hospital.   CONTINUE TO TAKE VASOTEC BUT DO NOT TAKE ON THE DAY OF SURGERY  CONTINUE TO TAKE LIPITOR AT NIGHT AS USUAL.  YOU MAY CONTINUE TO TAKE VITAMIN D3 BUT DO NOT TAKE ON THE DAY OF SURGERY     YOU MAY TAKE TYLENOL AS NEEDED FOR HEADACHE

## 2017-11-08 NOTE — Pre-Procedure Instructions (Signed)
EKG OK BY DR PENWARDEN 

## 2017-11-13 MED ORDER — CIPROFLOXACIN IN D5W 400 MG/200ML IV SOLN
400.0000 mg | INTRAVENOUS | Status: AC
  Administered 2017-11-14: 400 mg via INTRAVENOUS

## 2017-11-14 ENCOUNTER — Ambulatory Visit
Admission: RE | Admit: 2017-11-14 | Discharge: 2017-11-14 | Disposition: A | Discharge status: Home / Self Care | Payer: BLUE CROSS/BLUE SHIELD | Source: Ambulatory Visit | Attending: Radiation Oncology | Admitting: Radiation Oncology

## 2017-11-14 ENCOUNTER — Encounter
Admission: RE | Disposition: A | Discharge status: Home / Self Care | Payer: Self-pay | Source: Ambulatory Visit | Attending: Urology

## 2017-11-14 ENCOUNTER — Ambulatory Visit
Admission: RE | Admit: 2017-11-14 | Discharge: 2017-11-14 | Disposition: A | Discharge status: Home / Self Care | Payer: BLUE CROSS/BLUE SHIELD | Source: Ambulatory Visit | Attending: Urology | Admitting: Urology

## 2017-11-14 ENCOUNTER — Encounter: Admission: RE | Payer: Self-pay | Source: Ambulatory Visit

## 2017-11-14 ENCOUNTER — Encounter: Payer: Self-pay | Admitting: *Deleted

## 2017-11-14 ENCOUNTER — Ambulatory Visit: Payer: BLUE CROSS/BLUE SHIELD | Admitting: Anesthesiology

## 2017-11-14 ENCOUNTER — Ambulatory Visit: Admission: RE | Admit: 2017-11-14 | Payer: BLUE CROSS/BLUE SHIELD | Source: Ambulatory Visit | Admitting: Urology

## 2017-11-14 ENCOUNTER — Other Ambulatory Visit: Payer: Self-pay

## 2017-11-14 DIAGNOSIS — Z79899 Other long term (current) drug therapy: Secondary | ICD-10-CM | POA: Insufficient documentation

## 2017-11-14 DIAGNOSIS — E785 Hyperlipidemia, unspecified: Secondary | ICD-10-CM | POA: Insufficient documentation

## 2017-11-14 DIAGNOSIS — Z8249 Family history of ischemic heart disease and other diseases of the circulatory system: Secondary | ICD-10-CM

## 2017-11-14 DIAGNOSIS — E119 Type 2 diabetes mellitus without complications: Secondary | ICD-10-CM | POA: Insufficient documentation

## 2017-11-14 DIAGNOSIS — I10 Essential (primary) hypertension: Secondary | ICD-10-CM

## 2017-11-14 DIAGNOSIS — C61 Malignant neoplasm of prostate: Secondary | ICD-10-CM | POA: Diagnosis not present

## 2017-11-14 DIAGNOSIS — Z7982 Long term (current) use of aspirin: Secondary | ICD-10-CM

## 2017-11-14 DIAGNOSIS — Z8673 Personal history of transient ischemic attack (TIA), and cerebral infarction without residual deficits: Secondary | ICD-10-CM

## 2017-11-14 HISTORY — PX: RADIOACTIVE SEED IMPLANT: SHX5150

## 2017-11-14 LAB — GLUCOSE, CAPILLARY
GLUCOSE-CAPILLARY: 154 mg/dL — AB (ref 65–99)
Glucose-Capillary: 139 mg/dL — ABNORMAL HIGH (ref 65–99)

## 2017-11-14 SURGERY — INSERTION, RADIATION SOURCE, PROSTATE
Anesthesia: General | Site: Prostate | Wound class: Clean Contaminated

## 2017-11-14 SURGERY — INSERTION, RADIATION SOURCE, PROSTATE
Anesthesia: Choice

## 2017-11-14 MED ORDER — GLYCOPYRROLATE 0.2 MG/ML IJ SOLN
INTRAMUSCULAR | Status: AC
  Filled 2017-11-14: qty 1

## 2017-11-14 MED ORDER — ROCURONIUM BROMIDE 50 MG/5ML IV SOLN
INTRAVENOUS | Status: AC
  Filled 2017-11-14: qty 1

## 2017-11-14 MED ORDER — PROPOFOL 500 MG/50ML IV EMUL
INTRAVENOUS | Status: AC
  Filled 2017-11-14: qty 50

## 2017-11-14 MED ORDER — CIPROFLOXACIN HCL 500 MG PO TABS: 500.0000 mg | ORAL_TABLET | Freq: Once | ORAL | 0 refills | Status: AC

## 2017-11-14 MED ORDER — ARTIFICIAL TEARS OPHTHALMIC OINT
TOPICAL_OINTMENT | OPHTHALMIC | Status: AC
  Filled 2017-11-14: qty 3.5

## 2017-11-14 MED ORDER — DEXAMETHASONE SODIUM PHOSPHATE 10 MG/ML IJ SOLN
INTRAMUSCULAR | Status: AC
Start: 2017-11-14 — End: ?
  Filled 2017-11-14: qty 1

## 2017-11-14 MED ORDER — PHENYLEPHRINE 40 MCG/ML (10ML) SYRINGE FOR IV PUSH (FOR BLOOD PRESSURE SUPPORT)
PREFILLED_SYRINGE | INTRAVENOUS | Status: DC | PRN
  Administered 2017-11-14 (×4): 80 ug via INTRAVENOUS

## 2017-11-14 MED ORDER — SUGAMMADEX SODIUM 200 MG/2ML IV SOLN
INTRAVENOUS | Status: DC | PRN
  Administered 2017-11-14: 145.2 mg via INTRAVENOUS

## 2017-11-14 MED ORDER — HYDROCODONE-ACETAMINOPHEN 5-325 MG PO TABS: 1.0000 | ORAL_TABLET | Freq: Four times a day (QID) | ORAL | 0 refills | Status: DC | PRN

## 2017-11-14 MED ORDER — EPHEDRINE SULFATE 50 MG/ML IJ SOLN
INTRAMUSCULAR | Status: DC | PRN
  Administered 2017-11-14: 10 mg via INTRAVENOUS

## 2017-11-14 MED ORDER — ONDANSETRON HCL 4 MG/2ML IJ SOLN: 4.0000 mg | Freq: Once | INTRAMUSCULAR | Status: DC | PRN

## 2017-11-14 MED ORDER — MIDAZOLAM HCL 2 MG/2ML IJ SOLN
INTRAMUSCULAR | Status: AC
  Filled 2017-11-14: qty 2

## 2017-11-14 MED ORDER — FAMOTIDINE 20 MG PO TABS
20.0000 mg | ORAL_TABLET | Freq: Once | ORAL | Status: AC
  Administered 2017-11-14: 20 mg via ORAL

## 2017-11-14 MED ORDER — FENTANYL CITRATE (PF) 100 MCG/2ML IJ SOLN: 25.0000 ug | INTRAMUSCULAR | Status: DC | PRN

## 2017-11-14 MED ORDER — ONDANSETRON HCL 4 MG/2ML IJ SOLN
INTRAMUSCULAR | Status: DC | PRN
  Administered 2017-11-14: 4 mg via INTRAVENOUS

## 2017-11-14 MED ORDER — FENTANYL CITRATE (PF) 100 MCG/2ML IJ SOLN
INTRAMUSCULAR | Status: AC
  Filled 2017-11-14: qty 2

## 2017-11-14 MED ORDER — DOCUSATE SODIUM 100 MG PO CAPS: 100.0000 mg | ORAL_CAPSULE | Freq: Two times a day (BID) | ORAL | 0 refills | Status: AC

## 2017-11-14 MED ORDER — MIDAZOLAM HCL 5 MG/5ML IJ SOLN
INTRAMUSCULAR | Status: DC | PRN
  Administered 2017-11-14: 2 mg via INTRAVENOUS

## 2017-11-14 MED ORDER — TAMSULOSIN HCL 0.4 MG PO CAPS: 0.4000 mg | ORAL_CAPSULE | Freq: Every day | ORAL | 0 refills | Status: DC

## 2017-11-14 MED ORDER — CIPROFLOXACIN IN D5W 400 MG/200ML IV SOLN
INTRAVENOUS | Status: AC
  Filled 2017-11-14: qty 200

## 2017-11-14 MED ORDER — PROPOFOL 10 MG/ML IV BOLUS
INTRAVENOUS | Status: DC | PRN
  Administered 2017-11-14: 50 mg via INTRAVENOUS
  Administered 2017-11-14: 150 mg via INTRAVENOUS

## 2017-11-14 MED ORDER — ONDANSETRON HCL 4 MG/2ML IJ SOLN
INTRAMUSCULAR | Status: AC
Start: 2017-11-14 — End: ?
  Filled 2017-11-14: qty 2

## 2017-11-14 MED ORDER — ROCURONIUM BROMIDE 100 MG/10ML IV SOLN
INTRAVENOUS | Status: DC | PRN
  Administered 2017-11-14: 40 mg via INTRAVENOUS

## 2017-11-14 MED ORDER — DEXAMETHASONE SODIUM PHOSPHATE 10 MG/ML IJ SOLN
INTRAMUSCULAR | Status: DC | PRN
  Administered 2017-11-14: 10 mg via INTRAVENOUS

## 2017-11-14 MED ORDER — BACITRACIN ZINC 500 UNIT/GM EX OINT
TOPICAL_OINTMENT | CUTANEOUS | Status: AC
  Filled 2017-11-14: qty 28.35

## 2017-11-14 MED ORDER — SODIUM CHLORIDE 0.9 % IV SOLN
INTRAVENOUS | Status: DC
  Administered 2017-11-14: 06:00:00 via INTRAVENOUS

## 2017-11-14 MED ORDER — SEVOFLURANE IN SOLN
RESPIRATORY_TRACT | Status: AC
  Filled 2017-11-14: qty 250

## 2017-11-14 MED ORDER — OXYBUTYNIN CHLORIDE 5 MG PO TABS: 5.0000 mg | ORAL_TABLET | Freq: Three times a day (TID) | ORAL | 1 refills | Status: DC

## 2017-11-14 MED ORDER — FAMOTIDINE 20 MG PO TABS
ORAL_TABLET | ORAL | Status: AC
  Filled 2017-11-14: qty 1

## 2017-11-14 MED ORDER — SUGAMMADEX SODIUM 200 MG/2ML IV SOLN
INTRAVENOUS | Status: AC
  Filled 2017-11-14: qty 2

## 2017-11-14 MED ORDER — BACITRACIN 500 UNIT/GM EX OINT
TOPICAL_OINTMENT | CUTANEOUS | Status: DC | PRN
  Administered 2017-11-14: 1 via TOPICAL

## 2017-11-14 MED ORDER — LIDOCAINE 2% (20 MG/ML) 5 ML SYRINGE
INTRAMUSCULAR | Status: DC | PRN
  Administered 2017-11-14: 100 mg via INTRAVENOUS

## 2017-11-14 MED ORDER — FENTANYL CITRATE (PF) 100 MCG/2ML IJ SOLN
INTRAMUSCULAR | Status: DC | PRN
  Administered 2017-11-14 (×2): 50 ug via INTRAVENOUS
  Administered 2017-11-14: 100 ug via INTRAVENOUS

## 2017-11-14 SURGICAL SUPPLY — 25 items
BAG URINE DRAINAGE (UROLOGICAL SUPPLIES) ×3 IMPLANT
BLADE CLIPPER SURG (BLADE) ×3 IMPLANT
CATH FOL 2WAY LX 16X5 (CATHETERS) ×3 IMPLANT
DRAPE INCISE 23X17 IOBAN STRL (DRAPES) ×2
DRAPE INCISE IOBAN 23X17 STRL (DRAPES) ×1 IMPLANT
DRAPE SHEET LG 3/4 BI-LAMINATE (DRAPES) ×3 IMPLANT
DRAPE TABLE BACK 80X90 (DRAPES) ×3 IMPLANT
DRAPE UNDER BUTTOCK W/FLU (DRAPES) ×3 IMPLANT
DRSG TELFA 3X8 NADH (GAUZE/BANDAGES/DRESSINGS) ×3 IMPLANT
GLOVE BIO SURGEON STRL SZ 6.5 (GLOVE) ×4 IMPLANT
GLOVE BIO SURGEON STRL SZ7.5 (GLOVE) ×6 IMPLANT
GLOVE BIO SURGEONS STRL SZ 6.5 (GLOVE) ×2
GOWN STRL REUS W/ TWL LRG LVL3 (GOWN DISPOSABLE) ×2 IMPLANT
GOWN STRL REUS W/ TWL XL LVL3 (GOWN DISPOSABLE) ×1 IMPLANT
GOWN STRL REUS W/TWL LRG LVL3 (GOWN DISPOSABLE) ×4
GOWN STRL REUS W/TWL XL LVL3 (GOWN DISPOSABLE) ×2
IV NS 1000ML (IV SOLUTION) ×2
IV NS 1000ML BAXH (IV SOLUTION) ×1 IMPLANT
KIT TURNOVER CYSTO (KITS) ×3 IMPLANT
PACK CYSTO AR (MISCELLANEOUS) ×3 IMPLANT
SET CYSTO W/LG BORE CLAMP LF (SET/KITS/TRAYS/PACK) ×3 IMPLANT
SURGILUBE 2OZ TUBE FLIPTOP (MISCELLANEOUS) ×3 IMPLANT
SYR 10ML LL (SYRINGE) ×3 IMPLANT
SYRINGE IRR TOOMEY STRL 70CC (SYRINGE) IMPLANT
WATER STERILE IRR 1000ML POUR (IV SOLUTION) ×3 IMPLANT

## 2017-11-14 NOTE — Anesthesia Post-op Follow-up Note (Signed)
Anesthesia QCDR form completed.        

## 2017-11-14 NOTE — Op Note (Signed)
11/14/17  Preoperative diagnosis: Adenocarcinoma of the prostate   Postoperative diagnosis: Same   Procedure: I-125 prostate seed implantation, cystoscopy  Surgeon: Hollice Espy M.D.   Radiation Oncology: Lavena Stanford, M.D.   Anesthesia: General  Drains: none  Complications: none  Procedure: Patient was brought to operating suite and placement table in the supine position. At this time, a universal timeout protocol was performed, all team members were identified, Venodyne boots are placed, and he was administered IV Ancef in the preoperative period. He was placed in lithotomy position and prepped and draped in usual manner. Radiation oncology department placed a transrectal ultrasound probe anchoring stand/ grid and aligned with previous imaging from the volume study. Foley catheter was inserted without difficulty.  All needle passage was done with real-time transrectal ultrasound guidance in both the transverse and sagittal plains in order to achieve the desired preplanned position. A total of 26 needles were placed.  72 active seeds were implanted. The Foley catheter was removed and a rigid cystoscopy failed to show any seeds outside the prostate without evidence of trauma to the urethral, prostatic fossa, or bladder.  The bladder was drained.  A fluoroscopic image was then obtained showing excellent distrubution of the brachytherapy seeds.  Each seed was counted and counts were correct.    The patient was then repositioned in the supine position, reversed from anesthesia, and taken to the PACU in stable condition.  Hollice Espy, MD

## 2017-11-14 NOTE — Discharge Instructions (Addendum)
Brachytherapy for Prostate Cancer, Care After Refer to this sheet in the next few weeks. These instructions provide you with information on caring for yourself after your procedure. Your health care provider may also give you more specific instructions. Your treatment has been planned according to current medical practices, but problems sometimes occur. Call your health care provider if you have any problems or questions after your procedure. What can I expect after the procedure? The area behind the scrotum will probably be tender and bruised. For a short period of time you may have:  Difficulty passing urine. You may need a catheter for a few days to a month.  Blood in the urine or semen.  A feeling of constipation because of prostate swelling.  Frequent feeling of an urgent need to urinate.  For a long period of time you may have:  Inflammation of the rectum. This happens in about 2% of people who have the procedure.  Erection problems. These vary with age and occur in about 15-40% of men.  Difficulty urinating. This is caused by scarring in the urethra.  Diarrhea.  Follow these instructions at home:  Take medicines only as directed by your health care provider.  You will probably have a catheter in your bladder for several days. You will have blood in the urine bag and should drink a lot of fluids to keep it a light red color.  Keep all follow-up visits as directed by your health care provider. If you have a catheter, it will be removed during one of these visits.  Try not to sit directly on the area behind the scrotum. A soft cushion can decrease the discomfort. Ice packs may also be helpful for the discomfort. Do not put ice directly on the skin.  Shower and wash the area behind the scrotum gently. Do not sit in a tub.  If you have had the brachytherapy that uses the seeds, limit your close contact with children and pregnant women for 2 months because of the radiation still  in the prostate. After that period of time, the levels drop off quickly. Get help right away if:  You have a fever.  You have chills.  You have shortness of breath.  You have chest pain.  You have thick blood, like tomato juice, in the urine bag.  Your catheter is blocked so urine cannot get into the bag. Your bladder area or lower abdomen may be swollen.  There is excessive bleeding from your rectum. It is normal to have a little blood mixed with your stool.  There is severe discomfort in the treated area that does not go away with pain medicine.  You have abdominal discomfort.  You have severe nausea or vomiting.  You develop any new or unusual symptoms. This information is not intended to replace advice given to you by your health care provider. Make sure you discuss any questions you have with your health care provider. Document Released: 08/21/2010 Document Revised: 12/31/2015 Document Reviewed: 01/09/2013 Elsevier Interactive Patient Education  2017 [place]   1) The drugs that you were given will stay in your system until tomorrow so for the next 24 hours you should not:  A) Drive an automobile B) Make any legal decisions C) Drink any alcoholic beverage   2) You may resume regular meals tomorrow.  Today it is better to start with liquids and gradually work up to solid foods.  You may eat anything you prefer,  but it is better to start with liquids, then soup and crackers, and gradually work up to solid foods.   3) Please notify your doctor immediately if you have any unusual bleeding, trouble breathing, redness and pain at the surgery site, drainage, fever, or pain not relieved by medication.    4) Additional Instructions: TAKE A STOOL SOFTENER TWICE A DAY WHILE TAKING NARCOTIC PAIN MEDICINE TO PREVENT CONSTIPATION   Please contact your physician with any problems or Same Day Surgery at [phone], Monday  through Friday 6 am to 4 pm, or [place] at [facility] number at [phone].

## 2017-11-14 NOTE — H&P (Signed)
History and physical EVALUATION 11/14/17 All questions answered Presents today for I-125 seed implant after previous volume study RRR CTAB  Name: [name]        MRN: [id number]        Date:   10/10/2017     DOB: [birth year]   This 67 y.o. male patient presents to the clinic for history and physical prior to I-125 interstitial implant for adenocarcinoma the prostate  REFERRING PHYSICIAN: Idelle Crouch, MD  CHIEF COMPLAINT:      Chief Complaint  Patient presents with  . Prostate Cancer    post volume study    DIAGNOSIS: The encounter diagnosis was Malignant neoplasm of prostate ([place]).   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 His initial PSA was 5.6. He's been undergoing external beam radiation therapy to both his prostate and pelvic nodes is now seen for volume study in anticipation of a I-125 interstitial implant for boost. He continues to do well very little side effects or complaints. Specifically denies diarrhea dysuria or any other GI/GU complaints.  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    PAST SURGICAL HISTORY:       Past Surgical History:  Procedure Laterality Date  . LIPOMA EXCISION      FAMILY HISTORY: family history includes Heart attack in his father.  SOCIAL HISTORY:  reports that  has never smoked. he has never used smokeless tobacco.  ALLERGIES: Patient has no known allergies.  MEDICATIONS:        Current Outpatient Medications  Medication Sig Dispense Refill  . amLODipine (NORVASC) 5 MG tablet  Take 5 mg by mouth daily.     Marland Kitchen aspirin EC 81 MG tablet Take 81 mg by mouth daily.    Marland Kitchen atorvastatin (LIPITOR) 40 MG tablet Take 40 mg by mouth daily.     . Cholecalciferol (VITAMIN D3 PO) Take 1 capsule by mouth daily.    . enalapril (VASOTEC) 20 MG tablet Take 20 mg by mouth daily.     . metoprolol tartrate (LOPRESSOR) 50 MG tablet Take 50 mg by mouth daily.      No current facility-administered medications for this encounter.     ECOG PERFORMANCE STATUS:  0 - Asymptomatic  REVIEW OF SYSTEMS:  Patient denies any weight loss, fatigue, weakness, fever, chills or night sweats. Patient denies any loss of vision, blurred vision. Patient denies any ringing  of the ears or hearing loss. No irregular heartbeat. Patient denies heart murmur or history of fainting. Patient denies any chest pain or pain radiating to her upper extremities. Patient denies any shortness of breath, difficulty breathing at night, cough or hemoptysis. Patient denies any swelling in the lower legs. Patient denies any nausea vomiting, vomiting of blood, or coffee ground material in the vomitus. Patient denies any stomach pain. Patient states has had normal bowel movements no significant constipation or diarrhea. Patient denies any dysuria, hematuria or significant nocturia. Patient denies any problems walking, swelling in the joints or loss of balance. Patient denies any skin changes, loss of hair or loss of weight. Patient denies any excessive worrying or anxiety or significant depression. Patient denies  any problems with insomnia. Patient denies excessive thirst, polyuria, polydipsia. Patient denies any swollen glands, patient denies easy bruising or easy bleeding. Patient denies any recent infections, allergies or URI. Patient "s visual fields have not changed significantly in recent time.    PHYSICAL EXAM: BP (!) 190/107   Pulse 83   Temp 97.9 F (36.6 C)   Resp 18   Wt 163 lb 9.3 oz (74.2 kg)   BMI 27.22  kg/m  Well-developed well-nourished patient in NAD. HEENT reveals PERLA, EOMI, discs not visualized.  Oral cavity is clear. No oral mucosal lesions are identified. Neck is clear without evidence of cervical or supraclavicular adenopathy. Lungs are clear to A&P. Cardiac examination is essentially unremarkable with regular rate and rhythm without murmur rub or thrill. Abdomen is benign with no organomegaly or masses noted. Motor sensory and DTR levels are equal and symmetric in the upper and lower extremities. Cranial nerves II through XII are grossly intact. Proprioception is intact. No peripheral adenopathy or edema is identified. No motor or sensory levels are noted. Crude visual fields are within normal range.  LABORATORY DATA: Pathology reports reviewed and compatible with the above-stated findings    RADIOLOGY RESULTS: Ultrasound used for prostate volume study   IMPRESSION: Stage IIB adenocarcinoma the prostate Gleason score of 38 in 67 year old male currently undergoing external beam radiation therapy to prostate and pelvic nodes.  PLAN: At this time I to go ahead with I-125 interstitial implant for boost. We will perform volume study for brachytherapy treatment planning. Risks and benefits of treatment including again increased urinary tract symptoms diarrhea fatigue and radiation safety precautions all reviewed with the patient and his wife. Patient is a" of unclear 3 and ancestry an interpreter was present for my history and physical. Patient knows to call prior to implant with any concerns. He continues external beam treatment.  I would like to take this opportunity to thank you for allowing me to participate in the care of your patient.Noreene Filbert, MD

## 2017-11-14 NOTE — Progress Notes (Signed)
Patient's wife called stating patient is having bladder spasms post brachytherapy this morning. Script for oxybutinin sent to the pharm. Patient is in discomfort and has the urge to urinate. It was explained that this is normal post op to feel discomfort and the urge was a bladder spasm and the oxybutinin should help.

## 2017-11-14 NOTE — Progress Notes (Signed)
Radiation Oncology Prostate implant note  Name: [name]   Date:   11/14/2017 MRN:  [id number] DOB: 06-Nov-1950    This 67 y.o. adult presents to the hospital today for I-125 interstitial implant for stage II adenocarcinoma the prostate.  REFERRING PROVIDER: Idelle Crouch, MD  HPI: patient is a 67 year old [name] male who has previously undergone external beam radiation therapy for a Gleason 8 (4+4) stage II adenocarcinoma presenting the PSA 5.6. In order radiation therapy to his prostate and pelvic knows he is seen today in the OR for I-125 interstitial implant..  COMPLICATIONS OF TREATMENT: none  FOLLOW UP COMPLIANCE: keeps appointments   PHYSICAL EXAM:  There were no vitals taken for this visit. Well-developed well-nourished patient in NAD. HEENT reveals PERLA, EOMI, discs not visualized.  Oral cavity is clear. No oral mucosal lesions are identified. Neck is clear without evidence of cervical or supraclavicular adenopathy. Lungs are clear to A&P. Cardiac examination is essentially unremarkable with regular rate and rhythm without murmur rub or thrill. Abdomen is benign with no organomegaly or masses noted. Motor sensory and DTR levels are equal and symmetric in the upper and lower extremities. Cranial nerves II through XII are grossly intact. Proprioception is intact. No peripheral adenopathy or edema is identified. No motor or sensory levels are noted. Crude visual fields are within normal range.  RADIOLOGY RESULTS: ultrasound and plain films use 4 hours prostate implant  PLAN: patient was taken to the operating room and general anesthesia was administered. Legs were immobilized in stirrups and patient was positioned in the exact same proportions as original volume study. Patient was prepped and Foley catheter was placed. Ultrasound guidance identified the prostate and recreated the original set up as per treatment planning volume study. 28 needles were placed under ultrasound  guidance with PVCs delivered to the prostate volume. After completion of procedure cystoscopy was performed by urology and no evidence of seeds in the bladder were noted. Patient tolerated the procedure extremely well. Initial plain film as doublecheck identified 72 seeds in the prostate. Patient has followup appointment in one month for CT scan for quality assurance will be performed.     Jeffery Filbert, MD

## 2017-11-14 NOTE — Transfer of Care (Signed)
Immediate Anesthesia Transfer of Care Note  Patient: [name]  Procedure(s) Performed: RADIOACTIVE SEED IMPLANT/BRACHYTHERAPY IMPLANT (N/A Prostate)  Patient Location: PACU  Anesthesia Type:General  Level of Consciousness: awake, alert  and oriented  Airway & Oxygen Therapy: Patient Spontanous Breathing and Patient connected to face mask oxygen  Post-op Assessment: Report given to RN and Post -op Vital signs reviewed and stable  Post vital signs: Reviewed and stable  Last Vitals:  Vitals Value Taken Time  BP    Temp    Pulse 83 11/14/2017  9:15 AM  Resp 11 11/14/2017  9:15 AM  SpO2 100 % 11/14/2017  9:15 AM  Vitals shown include unvalidated device data.  Last Pain:  Vitals:   11/14/17 0610  TempSrc: Temporal  PainSc: 0-No pain         Complications: No apparent anesthesia complications

## 2017-11-14 NOTE — Anesthesia Postprocedure Evaluation (Signed)
Anesthesia Post Note  Patient: [name]  Procedure(s) Performed: RADIOACTIVE SEED IMPLANT/BRACHYTHERAPY IMPLANT (N/A Prostate)  Patient location during evaluation: PACU Anesthesia Type: General Level of consciousness: awake and alert Pain management: pain level controlled Vital Signs Assessment: post-procedure vital signs reviewed and stable Respiratory status: spontaneous breathing and respiratory function stable Cardiovascular status: stable Anesthetic complications: no     Last Vitals:  Vitals:   11/14/17 0948 11/14/17 0957  BP: (!) 148/87 (!) 152/92  Pulse: 80 82  Resp: 10 12  Temp:  (!) 36.3 C  SpO2: 98% 94%    Last Pain:  Vitals:   11/14/17 0957  TempSrc: Tympanic  PainSc: 0-No pain                 KEPHART,WILLIAM K

## 2017-11-14 NOTE — Anesthesia Preprocedure Evaluation (Signed)
Anesthesia Evaluation  Patient identified by MRN, date of birth, ID band Patient awake    Reviewed: Allergy & Precautions, NPO status , Patient's Chart, lab work & pertinent test results, reviewed documented beta blocker date and time   History of Anesthesia Complications Negative for: history of anesthetic complications  Airway Mallampati: III       Dental   Pulmonary neg sleep apnea, neg COPD, former smoker,           Cardiovascular hypertension, Pt. on medications and Pt. on home beta blockers (-) Past MI and (-) CHF (-) dysrhythmias (-) Valvular Problems/Murmurs     Neuro/Psych CVA (dizziness, years ago), No Residual Symptoms    GI/Hepatic neg GERD  ,  Endo/Other  diabetes (borderline)  Renal/GU negative Renal ROS     Musculoskeletal   Abdominal   Peds  Hematology   Anesthesia Other Findings   Reproductive/Obstetrics                             Anesthesia Physical Anesthesia Plan  ASA: III  Anesthesia Plan: General   Post-op Pain Management:    Induction: Intravenous  PONV Risk Score and Plan: 2 and Dexamethasone and Ondansetron  Airway Management Planned: LMA and Oral ETT  Additional Equipment:   Intra-op Plan:   Post-operative Plan:   Informed Consent: I have reviewed the patients History and Physical, chart, labs and discussed the procedure including the risks, benefits and alternatives for the proposed anesthesia with the patient or authorized representative who has indicated his/her understanding and acceptance.     Plan Discussed with:   Anesthesia Plan Comments:         Anesthesia Quick Evaluation

## 2017-11-14 NOTE — Anesthesia Procedure Notes (Signed)
Procedure Name: Intubation Date/Time: 11/14/2017 8:23 AM Performed by: Marsh Dolly, CRNA Pre-anesthesia Checklist: Patient identified, Patient being monitored, Timeout performed, Emergency Drugs available and Suction available Patient Re-evaluated:Patient Re-evaluated prior to induction Oxygen Delivery Method: Circle system utilized Preoxygenation: Pre-oxygenation with 100% oxygen Induction Type: IV induction Ventilation: Mask ventilation without difficulty Laryngoscope Size: 3 and Miller Grade View: Grade I Tube type: Oral Tube size: 7.5 mm Number of attempts: 1 Placement Confirmation: ETT inserted through vocal cords under direct vision,  positive ETCO2 and breath sounds checked- equal and bilateral Secured at: 21 cm Tube secured with: Tape Dental Injury: Teeth and Oropharynx as per pre-operative assessment

## 2017-11-15 ENCOUNTER — Encounter: Payer: Self-pay | Admitting: Urology

## 2017-11-16 ENCOUNTER — Ambulatory Visit (INDEPENDENT_AMBULATORY_CARE_PROVIDER_SITE_OTHER): Payer: BLUE CROSS/BLUE SHIELD | Admitting: Family Medicine

## 2017-11-16 DIAGNOSIS — C61 Malignant neoplasm of prostate: Secondary | ICD-10-CM

## 2017-11-16 NOTE — Progress Notes (Signed)
Catheter Removal  Patient is present today for a catheter removal.  44ml of water was drained from the balloon. A16FR coude foley cath was removed from the bladder no complications were noted . Patient tolerated well.  Preformed by: Elberta Leatherwood, CMA  Follow up/ Additional notes: He is to return at [facility] if he is unable to empty bladder

## 2017-12-07 ENCOUNTER — Other Ambulatory Visit: Payer: Self-pay

## 2017-12-07 MED ORDER — TAMSULOSIN HCL 0.4 MG PO CAPS: 0.4000 mg | ORAL_CAPSULE | Freq: Every day | ORAL | 3 refills | Status: DC

## 2017-12-20 ENCOUNTER — Ambulatory Visit
Admission: RE | Admit: 2017-12-20 | Discharge: 2017-12-20 | Disposition: A | Discharge status: Home / Self Care | Payer: BLUE CROSS/BLUE SHIELD | Source: Ambulatory Visit | Attending: Radiation Oncology | Admitting: Radiation Oncology

## 2017-12-20 ENCOUNTER — Other Ambulatory Visit: Payer: Self-pay

## 2017-12-20 VITALS — BP 163/91 | HR 65 | Temp 98.0°F | Resp 18 | Wt 159.8 lb

## 2017-12-20 DIAGNOSIS — Z510 Encounter for antineoplastic radiation therapy: Secondary | ICD-10-CM | POA: Insufficient documentation

## 2017-12-20 DIAGNOSIS — C61 Malignant neoplasm of prostate: Secondary | ICD-10-CM | POA: Insufficient documentation

## 2017-12-20 NOTE — Progress Notes (Signed)
Radiation Oncology Follow up Note  Name: [name]   Date:   12/20/2017 MRN:  [id number] DOB: 10-19-1950    This 67 y.o. 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  REFERRING PROVIDER: Idelle Crouch, MD  HPI: patient is a 67 year old male now out 1 month having completed I-125 interstitial implant for boost as part of treatment plan with external beam radiation therapy to prostate and pelvic nodes for Gleason 8 (4+4) adenocarcinoma the prostate presenting the PSA 5.6. Seen today in routine follow-up he is doing well he specifically denies urinary frequency urgency or diarrhea..  COMPLICATIONS OF TREATMENT: none  FOLLOW UP COMPLIANCE: keeps appointments   PHYSICAL EXAM:  BP (!) 163/91   Pulse 65   Temp 98 F (36.7 C)   Resp 18   Wt 159 lb 13.3 oz (72.5 kg)   BMI 26.60 kg/m  Well-developed well-nourished patient in NAD. HEENT reveals PERLA, EOMI, discs not visualized.  Oral cavity is clear. No oral mucosal lesions are identified. Neck is clear without evidence of cervical or supraclavicular adenopathy. Lungs are clear to A&P. Cardiac examination is essentially unremarkable with regular rate and rhythm without murmur rub or thrill. Abdomen is benign with no organomegaly or masses noted. Motor sensory and DTR levels are equal and symmetric in the upper and lower extremities. Cranial nerves II through XII are grossly intact. Proprioception is intact. No peripheral adenopathy or edema is identified. No motor or sensory levels are noted. Crude visual fields are within normal range.  RADIOLOGY RESULTS: CT scan for quality assurance performed showing excellent source placements  PLAN: present time patient is doing well 1 month out of explained to him our PSA protocol I've asked to see him back in 3 months for follow-up and  will obtain a PSA prior to that if it is not been performed. He continues to do well. Patient is to call with any concerns.    I would like to take this opportunity to thank you for allowing me to participate in the care of your patient.Noreene Filbert, MD

## 2017-12-23 DIAGNOSIS — Z510 Encounter for antineoplastic radiation therapy: Secondary | ICD-10-CM | POA: Diagnosis not present

## 2017-12-27 DIAGNOSIS — Z510 Encounter for antineoplastic radiation therapy: Secondary | ICD-10-CM | POA: Diagnosis not present

## 2018-01-12 IMAGING — CT CT ABD-PELV W/ CM
1 of 3 series · 14 of 32 positions shown, 19 images · IV contrast (APPLIED)
Comparison: None.

CLINICAL DATA: Newly diagnosed high-grade prostate carcinoma.
Staging.

EXAM:
CT ABDOMEN AND PELVIS WITH CONTRAST
TECHNIQUE: Multidetector CT imaging of the abdomen and pelvis was performed
using the standard protocol following bolus administration of
intravenous contrast.
CONTRAST:  100mL [id number] IOPAMIDOL ([id number]) INJECTION 61%

[Series 2: axial st · axial · 0.71mm/px · z∈[-1058,-628]mm · 14 of 98 slices shown, 19 images]
[im 6/98  soft-tissue]
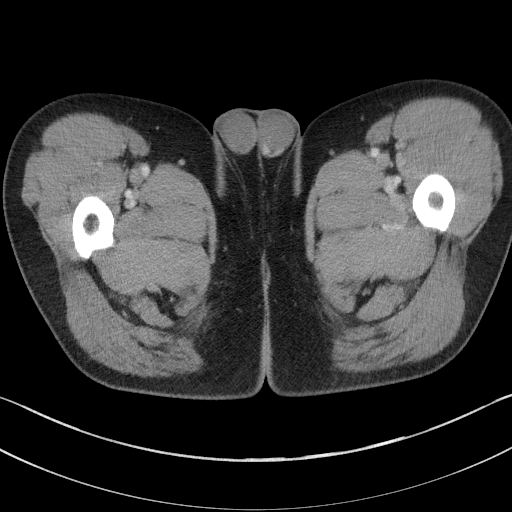
[im 6/98  bone]
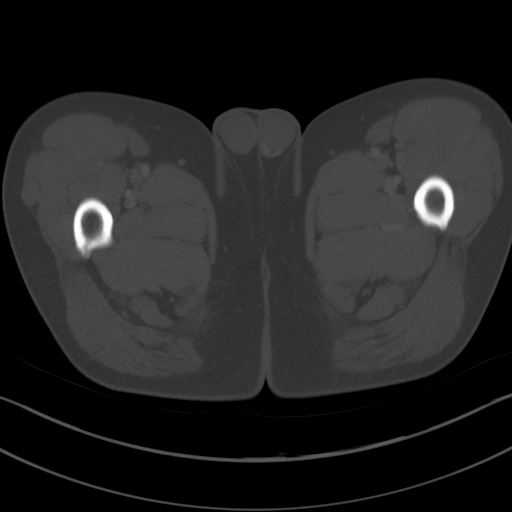
[im 11/98  soft-tissue]
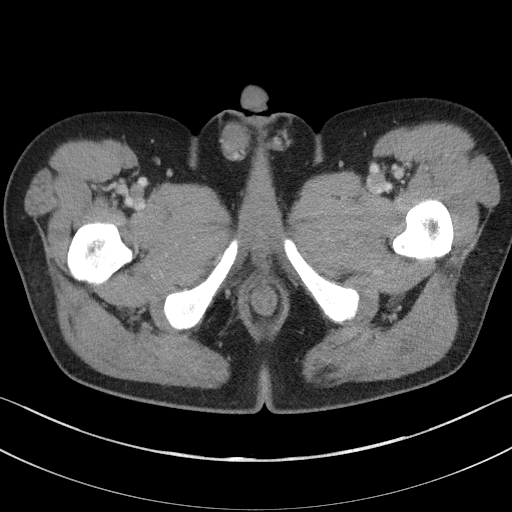
[im 22/98  soft-tissue]
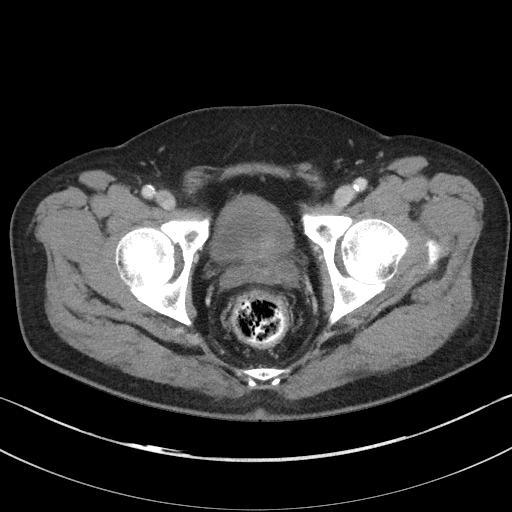
[im 27/98  soft-tissue]
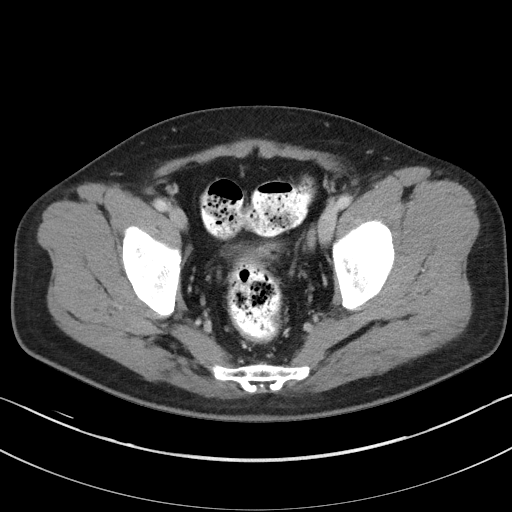
[im 33/98  soft-tissue]
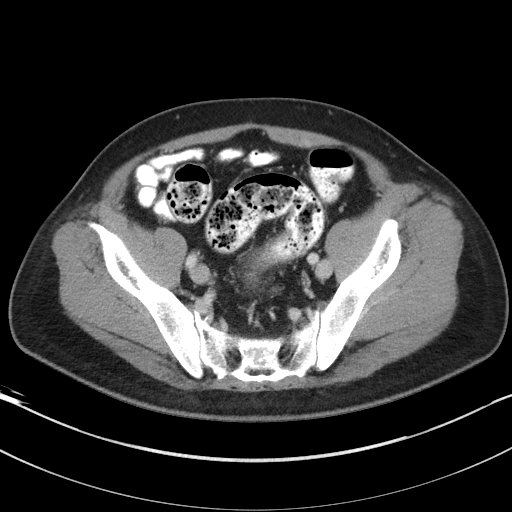
[im 44/98  soft-tissue]
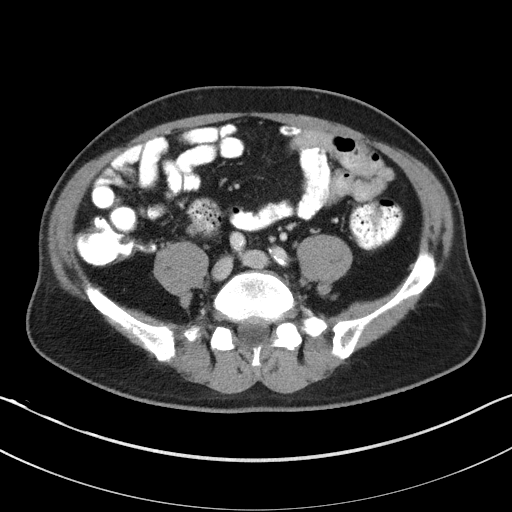
[im 49/98  soft-tissue]
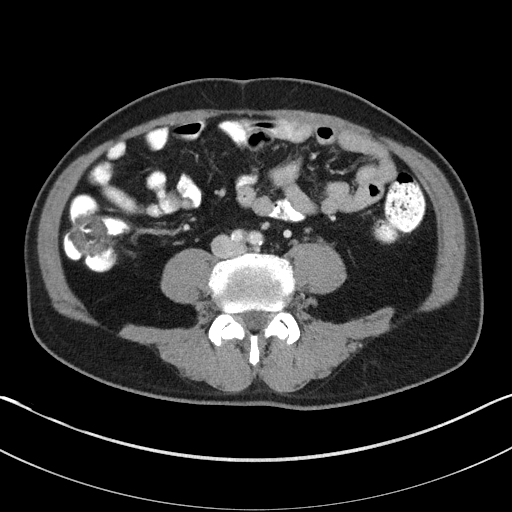
[im 54/98  soft-tissue]
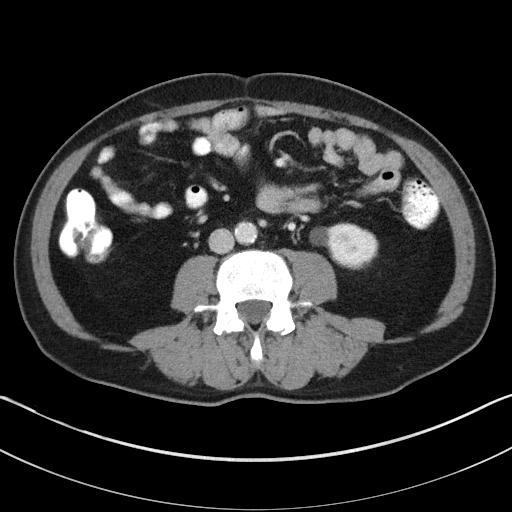
[im 65/98  soft-tissue]
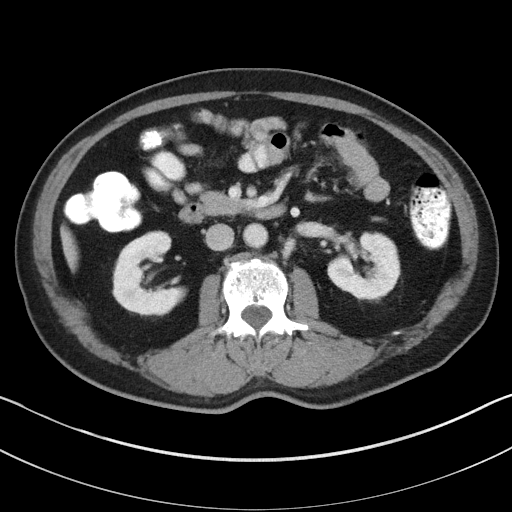
[im 65/98  bone]
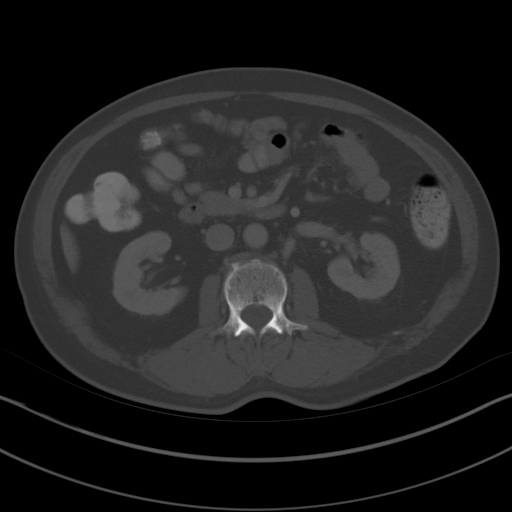
[im 71/98  soft-tissue]
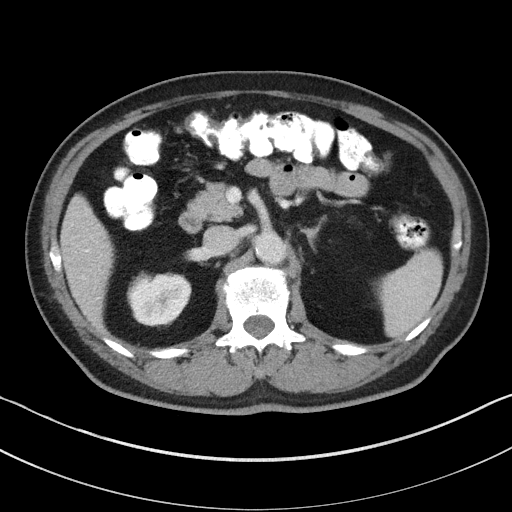
[im 76/98  soft-tissue]
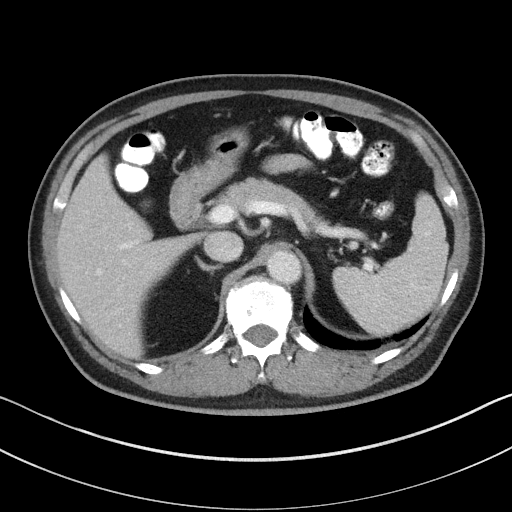
[im 76/98  lung]
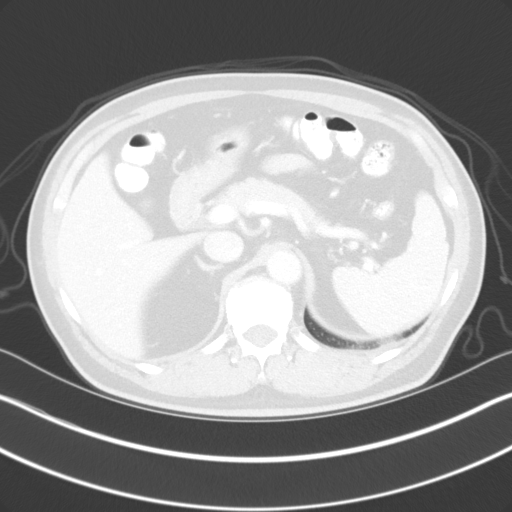
[im 81/98  lung]
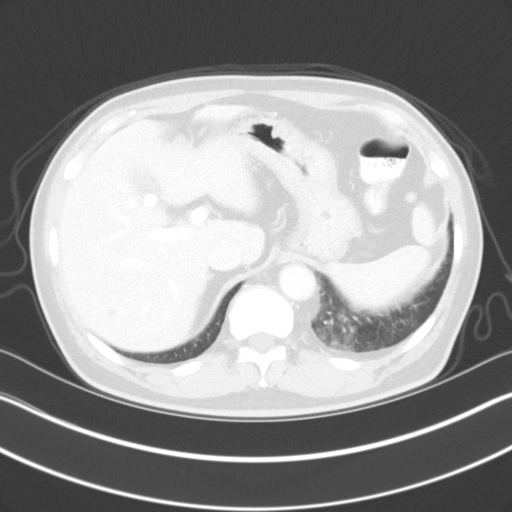
[im 87/98  soft-tissue]
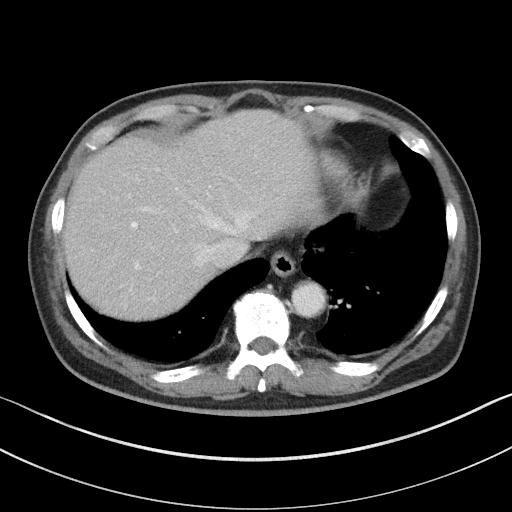
[im 87/98  lung]
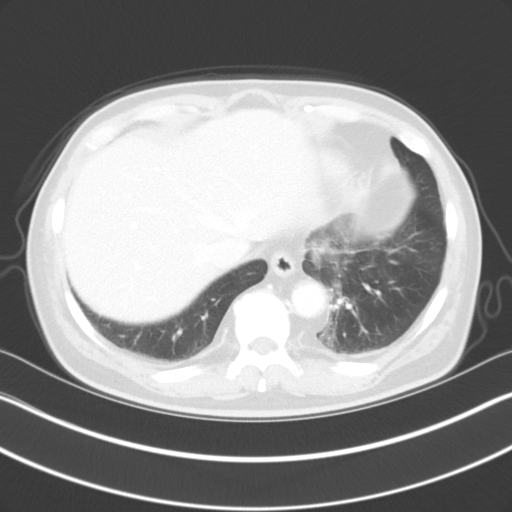
[im 92/98  soft-tissue]
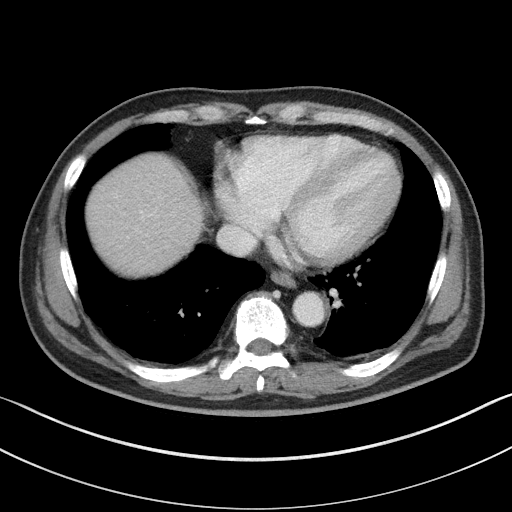
[im 92/98  lung]
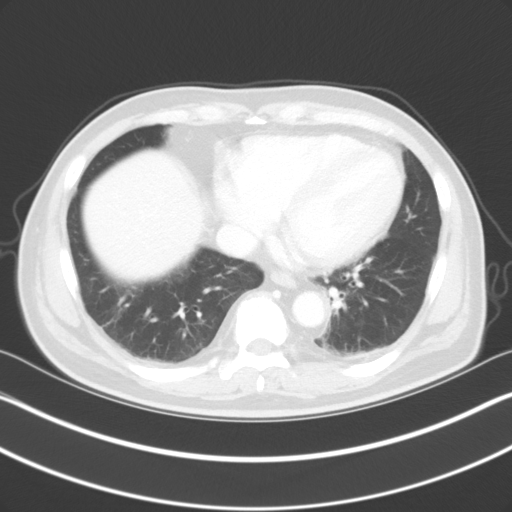

[14 of 32 positions shown; findings below may reference images not displayed]

FINDINGS: Lower Chest: No acute findings.

Hepatobiliary: No hepatic masses identified. Tiny sub-cm cyst noted
in posterior right hepatic lobe. 1 cm calcified gallstone is seen,
however there is no evidence of cholecystitis or biliary ductal
dilatation.

Pancreas:  No mass or inflammatory changes.

Spleen: Within normal limits in size and appearance.

Adrenals/Urinary Tract: No masses identified. Small subcapsular cyst
noted in lower pole of left kidney. No evidence of hydronephrosis.

Stomach/Bowel: No evidence of obstruction, inflammatory process or
abnormal fluid collections. Normal appendix visualized.

Vascular/Lymphatic: No pathologically enlarged lymph nodes. No
abdominal aortic aneurysm. Aortic atherosclerosis.

Reproductive:  No mass or other significant abnormality.

Other:  None.

Musculoskeletal:  No suspicious bone lesions identified.
IMPRESSION: No evidence of abdominal or pelvic metastatic disease.

Cholelithiasis.  No radiographic evidence of cholecystitis.

Aortic atherosclerosis.

## 2018-01-23 ENCOUNTER — Ambulatory Visit: Admission: RE | Admit: 2018-01-23 | Payer: BLUE CROSS/BLUE SHIELD | Source: Ambulatory Visit | Admitting: Urology

## 2018-02-10 IMAGING — NM NM BONE WHOLE BODY
1 series · 2 of 2 positions shown · non-contrast
Comparison: Abdominopelvic CT of 07/05/2017

CLINICAL DATA: Recent diagnosis of prostate cancer. Gleason score 8
or greater.

EXAM:
NUCLEAR MEDICINE WHOLE BODY BONE SCAN
TECHNIQUE: Whole body anterior and posterior images were obtained approximately
3 hours after intravenous injection of radiopharmaceutical.
RADIOPHARMACEUTICALS:  23.0 mCi 6echnetium-99m MDP IV

[Series 1000: 3 hr wholebody · 2.40mm/px · 2 of 2 frames shown]
[frame 1/2]
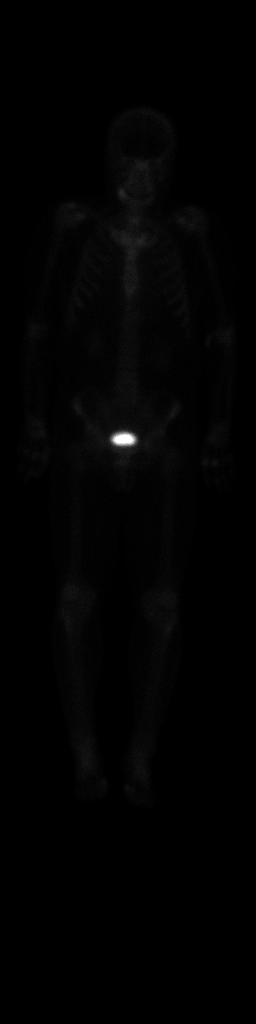
[frame 2/2]
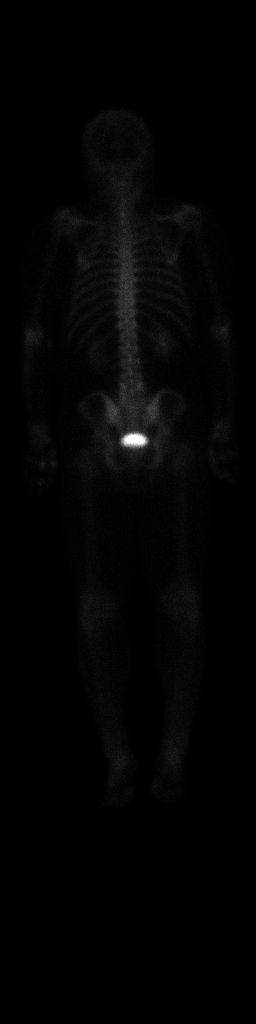

[2 of 2 positions shown; findings below may reference images not displayed]

FINDINGS: Scattered areas of degenerative uptake, including about the knees
and first metatarsal phalangeal joints bilaterally. Right mandibular
uptake may be due to dental infection/inflammation. Expected tracer
within the renal collecting systems and urinary bladder.
IMPRESSION: No findings to suggest osseous metastasis.

## 2018-02-14 ENCOUNTER — Ambulatory Visit: Payer: BLUE CROSS/BLUE SHIELD | Admitting: Urology

## 2018-02-14 ENCOUNTER — Encounter: Payer: Self-pay | Admitting: Urology

## 2018-02-14 VITALS — BP 164/89 | HR 76 | Wt 160.0 lb

## 2018-02-14 DIAGNOSIS — C61 Malignant neoplasm of prostate: Secondary | ICD-10-CM

## 2018-02-14 NOTE — Progress Notes (Signed)
02/14/2018 3:25 PM   [name] Aug 14, 1950 [id number]  Referring provider: Idelle Crouch, MD [place] [facility] [place], [place] [address]  Chief Complaint  Patient presents with  . Prostate Cancer    HPI: 67 year old male with high risk prostate cancer he returns today for routine follow-up.  He was initially diagnosed with Gleason 4+4 prostate cancer involving 9 and 12 cores dx 06/2017.  His initial PSA was 5.6.  Staging including CT scan and bone scan negative for evidence of metastatic diease.  He underwent external beam radiation followed by brachytherapy boost in 11/14/2017 which is well-tolerated.  Androgen deprivation was also recommended for at least 2 years.  Today, he denies any significant urinary symptoms.  He did take Flomax for about a month after the brachii therapy implant but is been able to stop this medication.  Has a slower stream but overall is improving.  No urgency or frequency.  No incontinence.  No dysuria or gross hematuria.   PMH: Past Medical History:  Diagnosis Date  . Benign hypertension 05/22/2017  . 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 (brain stem stroke)  . Cancer ([place]) 2018   adenocarcinoma of prostate  . Elevated PSA 2018  . History of vertigo 05/22/2017  . Hyperlipidemia, unspecified 05/22/2017  . Lipoma of neck 05/22/2017   Overview:  right  . Pre-diabetes 2018   HGBA1C RISING    Surgical History: Past Surgical History:  Procedure Laterality Date  . LIPOMA EXCISION Right 2014   RIGHT SIDE OF NECK  . RADIOACTIVE SEED IMPLANT N/A 11/14/2017   Procedure: RADIOACTIVE SEED IMPLANT/BRACHYTHERAPY IMPLANT;  Surgeon: Hollice Espy, MD;  Location: [facility] ORS;  Service: Urology;  Laterality: N/A;    Home Medications:  Allergies as of 02/14/2018   No Known Allergies     Medication List        Accurate as of 02/14/18  3:25 PM. Always use your most recent med list.            amLODipine 5 MG tablet Commonly known as:  NORVASC Take 5 mg by mouth daily.   aspirin EC 81 MG tablet Take 81 mg by mouth daily.   atorvastatin 40 MG tablet Commonly known as:  LIPITOR Take 40 mg by mouth daily.   docusate sodium 100 MG capsule Commonly known as:  COLACE Take 1 capsule (100 mg total) by mouth 2 (two) times daily.   enalapril 20 MG tablet Commonly known as:  VASOTEC Take 20 mg by mouth daily.   HYDROcodone-acetaminophen 5-325 MG tablet Commonly known as:  NORCO/VICODIN Take 1-2 tablets by mouth every 6 (six) hours as needed for moderate pain.   metoprolol tartrate 50 MG tablet Commonly known as:  LOPRESSOR Take 50 mg by mouth daily.   oxybutynin 5 MG tablet Commonly known as:  DITROPAN Take 1 tablet (5 mg total) by mouth 3 (three) times daily.   tamsulosin 0.4 MG Caps capsule Commonly known as:  FLOMAX Take 1 capsule (0.4 mg total) by mouth daily.   VITAMIN D3 PO Take 1 capsule by mouth daily.       Allergies: No Known Allergies  Family History: Family History  Problem Relation Age of Onset  . Heart attack Father     Social History:  reports that he quit smoking about 29 years ago. His smoking use included cigarettes. He smoked 1.00 pack per day. He has never used smokeless tobacco.  He reports that he drank alcohol. He reports that he does not use drugs.  ROS: UROLOGY Frequent Urination?: No Hard to postpone urination?: No Burning/pain with urination?: No Get up at night to urinate?: No Leakage of urine?: No Urine stream starts and stops?: No Trouble starting stream?: No Do you have to strain to urinate?: No Blood in urine?: No Urinary tract infection?: No Sexually transmitted disease?: No Injury to kidneys or bladder?: No Painful intercourse?: No Weak stream?: No Erection problems?: No Penile pain?: No Currently pregnant?: No Vaginal bleeding?: No Last menstrual period?: n  Gastrointestinal Nausea?:  No Vomiting?: No Indigestion/heartburn?: No Diarrhea?: No Constipation?: No  Constitutional Fever: No Night sweats?: No Weight loss?: No Fatigue?: No  Skin Skin rash/lesions?: No Itching?: No  Eyes Blurred vision?: No Double vision?: No  Ears/Nose/Throat Sore throat?: No Sinus problems?: No  Hematologic/Lymphatic Swollen glands?: No Easy bruising?: No  Cardiovascular Leg swelling?: No Chest pain?: No  Respiratory Cough?: No Shortness of breath?: No  Endocrine Excessive thirst?: No  Musculoskeletal Back pain?: No Joint pain?: No  Neurological Headaches?: No Dizziness?: No  Psychologic Depression?: No Anxiety?: No  Physical Exam: BP (!) 164/89   Pulse 76   Wt 160 lb (72.6 kg)   BMI 26.63 kg/m   Constitutional:  Alert and oriented, No acute distress. Wife present today. HEENT: [place] AT, moist mucus membranes.  Trachea midline, no masses. Cardiovascular: No clubbing, cyanosis, or edema. Respiratory: Normal respiratory effort, no increased work of breathing. Skin: No rashes, bruises or suspicious lesions. Neurologic: Grossly intact, no focal deficits, moving all 4 extremities. Psychiatric: Normal mood and affect.  Laboratory Data: Lab Results  Component Value Date   WBC 5.7 11/07/2017   HGB 14.7 11/07/2017   HCT 43.6 11/07/2017   MCV 90.1 11/07/2017   PLT 229 11/07/2017    Lab Results  Component Value Date   CREATININE 0.74 11/07/2017    Urinalysis N/a  Pertinent Imaging: No new interval imaging  Assessment & Plan:    1. Prostate cancer ([place]) Hx high risk Gleason 4+4 prostate cancer s/p IMRT with brachytherapy boost completed 09/2017 He is supposed to be on ADT for 2 years and is unclear whether or not he is received this and when his follow-up is due-will contact the cancer center to see if and when he received this and will work to remedy this if he is overdue for an injection Patient is agreeable this plan Somewhat premature to  recheck PSA at this point, will be seeing Dr. Baruch Gouty in about ~1.5, recommend PSA check at that time   Return in about 6 months (around 08/17/2018) for PSA.  Hollice Espy, MD  [facility] [address], [place],  [address] [phone]

## 2018-02-16 ENCOUNTER — Telehealth: Payer: Self-pay | Admitting: Urology

## 2018-02-16 NOTE — Telephone Encounter (Signed)
Left pt mess to call 

## 2018-02-16 NOTE — Telephone Encounter (Signed)
This patient was supposed to be started on Lupron back in January by the cancer center when he initiated radiation per Dr. Olena Leatherwood notes.  I just assumed that he was getting his Lupron there, however, I do not see any documentation that he ever actually received it. Can you please look into the chart and ensure that this is the case.?    If it is, he needs to come in and start androgen deprivation therapy for a total of 2 years as previously discussed for his high risk prostate cancer.  He can be given 6 mo depo.     Hollice Espy, MD

## 2018-02-24 NOTE — Telephone Encounter (Signed)
Left pt mess to call/SW 

## 2018-02-28 NOTE — Telephone Encounter (Signed)
Left mess for daughter and called wife with no vmail box set up, both on DPR

## 2018-02-28 NOTE — Telephone Encounter (Signed)
Left pt mess to call/SW 

## 2018-02-28 NOTE — Telephone Encounter (Signed)
Spoke with patient's wife and explained that Lupron therapy needs to be started and patient was scheduled for a nurse visit lupron 6 month injection

## 2018-03-07 ENCOUNTER — Ambulatory Visit (INDEPENDENT_AMBULATORY_CARE_PROVIDER_SITE_OTHER): Payer: BLUE CROSS/BLUE SHIELD

## 2018-03-07 DIAGNOSIS — C61 Malignant neoplasm of prostate: Secondary | ICD-10-CM

## 2018-03-07 MED ORDER — LEUPROLIDE ACETATE (6 MONTH) 45 MG IM KIT
45.0000 mg | PACK | Freq: Once | INTRAMUSCULAR | Status: AC
  Administered 2018-03-07: 45 mg via INTRAMUSCULAR

## 2018-03-07 NOTE — Progress Notes (Signed)
Lupron IM Injection   Due to Prostate Cancer patient is present today for a Lupron Injection.  Medication: Lupron 6 month Dose: 45 mg  Location: right upper outer buttocks Lot: [id number] Exp: 04/30/2020  Patient tolerated well, no complications were noted  Performed by: Cristie Hem, CMA  Follow up: 6 month

## 2018-03-29 ENCOUNTER — Ambulatory Visit
Admission: RE | Admit: 2018-03-29 | Discharge: 2018-03-29 | Disposition: A | Discharge status: Home / Self Care | Payer: BLUE CROSS/BLUE SHIELD | Source: Ambulatory Visit | Attending: Radiation Oncology | Admitting: Radiation Oncology

## 2018-03-29 ENCOUNTER — Other Ambulatory Visit: Payer: Self-pay

## 2018-03-29 ENCOUNTER — Encounter: Payer: Self-pay | Admitting: Radiation Oncology

## 2018-03-29 ENCOUNTER — Other Ambulatory Visit: Payer: Self-pay | Admitting: *Deleted

## 2018-03-29 ENCOUNTER — Inpatient Hospital Stay: Payer: BLUE CROSS/BLUE SHIELD | Attending: Radiation Oncology

## 2018-03-29 DIAGNOSIS — C61 Malignant neoplasm of prostate: Secondary | ICD-10-CM

## 2018-03-29 DIAGNOSIS — Z923 Personal history of irradiation: Secondary | ICD-10-CM | POA: Insufficient documentation

## 2018-03-29 LAB — PSA: Prostatic Specific Antigen: 0.37 ng/mL (ref 0.00–4.00)

## 2018-03-29 NOTE — Progress Notes (Signed)
Radiation Oncology Follow up Note  Name: [name]   Date:   03/29/2018 MRN:  [id number] DOB: 12-27-1950    This 67 y.o. adult presents to the clinic today for four-month follow-up status post both external beam radiation therapy as well as I-125 interstitial implant for boost for Gleason 8 adenocarcinoma the prostate presenting the PSA 5.6.  REFERRING PROVIDER: Idelle Crouch, MD  HPI: patient is a 67 year old male now out 4 months having completed both external beam radiation therapy to his prostate and pelvic nodes as well as and I 125 interstitial implant for boost for Gleason 8 (4+4) adenocarcinoma the prostate presenting the PSA 5.6. He is seen today in routine follow-up and is doing well. He specifically denies increased lower urinary tract symptoms diarrhea fatigue. He had his PSA testing today will report that separately..he is currently undergoing androgen deprivation therapy through Dr. Cherrie Gauze office.  COMPLICATIONS OF TREATMENT: none  FOLLOW UP COMPLIANCE: keeps appointments   PHYSICAL EXAM:  There were no vitals taken for this visit. On rectal exam rectal sphincter tone is good. Prostate is smooth contracted without evidence of nodularity or mass. Sulcus is preserved bilaterally. No discrete nodularity is identified. No other rectal abnormalities are noted.Well-developed well-nourished patient in NAD. HEENT reveals PERLA, EOMI, discs not visualized.  Oral cavity is clear. No oral mucosal lesions are identified. Neck is clear without evidence of cervical or supraclavicular adenopathy. Lungs are clear to A&P. Cardiac examination is essentially unremarkable with regular rate and rhythm without murmur rub or thrill. Abdomen is benign with no organomegaly or masses noted. Motor sensory and DTR levels are equal and symmetric in the upper and lower extremities. Cranial nerves II through XII are grossly intact. Proprioception is intact. No peripheral adenopathy or edema is identified.  No motor or sensory levels are noted. Crude visual fields are within normal range.  RADIOLOGY RESULTS: no current films for review  PLAN: present time he is doing well from a clinical standpoint. Will review his PSA when it becomes available. He continues on androgen deprivation therapy through Dr. Cherrie Gauze office. I have asked to see him back in 6 months for follow-up. He knows to call sooner with any concerns. I would like to take this opportunity to thank you for allowing me to participate in the care of your patient.Noreene Filbert, MD

## 2018-08-09 ENCOUNTER — Other Ambulatory Visit: Payer: Self-pay

## 2018-08-09 DIAGNOSIS — C61 Malignant neoplasm of prostate: Secondary | ICD-10-CM

## 2018-08-09 DIAGNOSIS — R972 Elevated prostate specific antigen [PSA]: Secondary | ICD-10-CM

## 2018-08-10 ENCOUNTER — Other Ambulatory Visit: Payer: Medicare Other

## 2018-08-10 DIAGNOSIS — R972 Elevated prostate specific antigen [PSA]: Secondary | ICD-10-CM

## 2018-08-10 DIAGNOSIS — C61 Malignant neoplasm of prostate: Secondary | ICD-10-CM

## 2018-08-11 LAB — PSA: Prostate Specific Ag, Serum: <0.1 ng/mL (ref 0.0–4.0)

## 2018-08-15 ENCOUNTER — Ambulatory Visit: Payer: BLUE CROSS/BLUE SHIELD | Admitting: Urology

## 2018-09-04 NOTE — Progress Notes (Signed)
09/06/2018  11:05 AM   Letitia Caul 01-26-1951 [id number]  Referring provider: Idelle Crouch, MD [place] [place], [place] [address]  Chief Complaint  Patient presents with  . Prostate Cancer    Interpreter ID #[id number] Roselyn Reef    HPI: [name] is a 68 y.o. 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  Communication was done with the aid of the interpreter line.  Patient was initially diagnosed 06/2017 with Gleason 4+4 prostate cancer involving 9 and 12 cores.  Patient's initial PSA was 5.6.  Staging included CT scan and bone scan negative for evidence of metastatic disease.  Patient is currently undergoing androgen deprivation treatment.  Patient says today that he overall feels like normal, with hot flashes getting less frequent.  He continues on Flomax.  Patient denies any significant urinary symptoms.   He is due for Lupron today.  He is taking calcium and vitamin D supplementation.  PSA Trend  11/13/2008, 1  04/12/2014, 2.54  10/29/2015, 3.13  04/18/2017, 5.61  05/17/2017, 5.3  03/29/2018, 0.37  08/10/2018, <0.1  PMH: Past Medical History:  Diagnosis Date  . Benign hypertension 05/22/2017  . 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 (brain stem stroke)  . Cancer ([place]) 2018   adenocarcinoma of prostate  . Elevated PSA 2018  . History of vertigo 05/22/2017  . Hyperlipidemia, unspecified 05/22/2017  . Lipoma of neck 05/22/2017   Overview:  right  . Pre-diabetes 2018   HGBA1C RISING    Surgical History: Past Surgical History:  Procedure Laterality Date  . LIPOMA EXCISION Right 2014   RIGHT SIDE OF NECK  . RADIOACTIVE SEED IMPLANT N/A 11/14/2017   Procedure: RADIOACTIVE SEED IMPLANT/BRACHYTHERAPY IMPLANT;  Surgeon: Hollice Espy, MD;  Location: [facility] ORS;  Service:  Urology;  Laterality: N/A;    Home Medications:  Allergies as of 09/06/2018   No Known Allergies     Medication List       Accurate as of September 06, 2018 11:59 PM. Always use your most recent med list.        amLODipine 5 MG tablet Commonly known as:  NORVASC Take 5 mg by mouth daily.   aspirin EC 81 MG tablet Take 81 mg by mouth daily.   atorvastatin 40 MG tablet Commonly known as:  LIPITOR Take 40 mg by mouth daily.   docusate sodium 100 MG capsule Commonly known as:  COLACE Take 1 capsule (100 mg total) by mouth 2 (two) times daily.   enalapril 20 MG tablet Commonly known as:  VASOTEC Take 20 mg by mouth daily.   metoprolol tartrate 50 MG tablet Commonly known as:  LOPRESSOR Take 50 mg by mouth daily.   tamsulosin 0.4 MG Caps capsule Commonly known as:  FLOMAX Take 1 capsule (0.4 mg total) by mouth daily.   VITAMIN D3 PO Take 1 capsule by mouth daily.       Allergies: No Known Allergies  Family History: Family History  Problem Relation Age of Onset  . Heart attack Father     Social History:  reports that he quit smoking about 30 years ago. His smoking use included cigarettes. He smoked 1.00 pack per day. He has never used smokeless tobacco. He reports previous alcohol use. He reports that he does not use drugs.  ROS: UROLOGY Frequent Urination?: Yes Hard to postpone urination?: No Burning/pain with urination?: No Get up at night to urinate?: No Leakage of urine?: No Urine stream starts and stops?: No Trouble starting stream?: No Do you have to strain to urinate?: No Blood in urine?: No Urinary tract infection?: No Sexually transmitted disease?: No Injury to kidneys or bladder?: No Painful intercourse?: No Weak stream?: No Erection problems?: No Penile pain?: No  Gastrointestinal Nausea?: No Vomiting?: No Indigestion/heartburn?: No Diarrhea?: No Constipation?: No  Constitutional Fever: No Night sweats?: No Weight loss?:  No Fatigue?: No  Skin Skin rash/lesions?: No Itching?: No  Eyes Blurred vision?: No Double vision?: No  Ears/Nose/Throat Sore throat?: No Sinus problems?: No  Hematologic/Lymphatic Swollen glands?: No Easy bruising?: No  Cardiovascular Leg swelling?: No Chest pain?: No  Respiratory Cough?: No Shortness of breath?: No  Endocrine Excessive thirst?: No  Musculoskeletal Back pain?: No Joint pain?: No  Neurological Headaches?: No Dizziness?: No  Psychologic Depression?: No Anxiety?: No  Physical Exam: BP (!) 150/87 (BP Location: Left Arm, Patient Position: Sitting, Cuff Size: Normal)   Pulse 74   Ht 5\' 5"  (1.651 m)   Wt 164 lb 9.6 oz (74.7 kg)   BMI 27.39 kg/m   Constitutional:  Well nourished. Alert and oriented, No acute distress.  Accompanied by wife today.  Phone translator used. Cardiovascular: No clubbing, cyanosis, or edema. Respiratory: Normal respiratory effort, no increased work of breathing. Skin: No rashes, bruises or suspicious lesions. Neurologic: Grossly intact, no focal deficits, moving all 4 extremities. Psychiatric: Normal mood and affect.  Laboratory Data: Lab Results  Component Value Date   WBC 5.7 11/07/2017   HGB 14.7 11/07/2017   HCT 43.6 11/07/2017   MCV 90.1 11/07/2017   PLT 229 11/07/2017   Lab Results  Component Value Date   CREATININE 0.74 11/07/2017   Results for Byrer, MAVRIC CORTRIGHT (MRN [id number]) as of 09/06/2018 13:07  Ref. Range 05/17/2017 15:04 03/29/2018 09:29 08/10/2018 15:45  Prostate Specific Ag, Serum Latest Ref Range: 0.0 - 4.0 ng/mL 5.3 (H)  <0.1  Prostatic Specific Antigen Latest Ref Range: 0.00 - 4.00 ng/mL  0.37    Assessment & Plan:    1. Prostate Cancer ([place]) - Hx high risk Gleason 4+4 prostate cancer s/p IMRT with brachytherapy boost completed 09/2017 -Encouraged weightbearing exercises, calcium and vitamin D supplementation - Patient will be on ADT for 2-3 years - Lupron injection every six months with PSA  prior - Leuprolide Acetate (6 Month) (LUPRON) injection 45 mg  2. Urinary frequency Continue Flomax as needed   Return in about 6 months (around 03/07/2019) for Lupron Injection with PSA prior.  [facility] [address], [place], [place] [address] [phone]  I, Adele Schilder, am acting as a scribe for Hollice Espy, MD.    I have reviewed the above documentation for accuracy and completeness, and I agree with the above.   Hollice Espy, MD

## 2018-09-06 ENCOUNTER — Ambulatory Visit (INDEPENDENT_AMBULATORY_CARE_PROVIDER_SITE_OTHER): Payer: Medicare Other | Admitting: Urology

## 2018-09-06 ENCOUNTER — Encounter: Payer: Self-pay | Admitting: Urology

## 2018-09-06 VITALS — BP 150/87 | HR 74 | Ht 65.0 in | Wt 164.6 lb

## 2018-09-06 DIAGNOSIS — R350 Frequency of micturition: Secondary | ICD-10-CM | POA: Diagnosis not present

## 2018-09-06 DIAGNOSIS — C61 Malignant neoplasm of prostate: Secondary | ICD-10-CM

## 2018-09-06 MED ORDER — LEUPROLIDE ACETATE (6 MONTH) 45 MG IM KIT
45.0000 mg | PACK | Freq: Once | INTRAMUSCULAR | Status: AC
  Administered 2018-09-06: 45 mg via INTRAMUSCULAR

## 2018-09-06 NOTE — Patient Instructions (Signed)

## 2018-09-07 ENCOUNTER — Ambulatory Visit: Payer: BLUE CROSS/BLUE SHIELD

## 2018-09-27 ENCOUNTER — Inpatient Hospital Stay: Payer: Self-pay

## 2018-10-04 ENCOUNTER — Ambulatory Visit: Payer: BLUE CROSS/BLUE SHIELD | Admitting: Radiation Oncology

## 2019-03-07 ENCOUNTER — Other Ambulatory Visit: Payer: Self-pay

## 2019-03-07 DIAGNOSIS — C61 Malignant neoplasm of prostate: Secondary | ICD-10-CM

## 2019-03-08 ENCOUNTER — Other Ambulatory Visit: Payer: Medicare Other

## 2019-03-08 ENCOUNTER — Encounter: Payer: Self-pay | Admitting: Urology

## 2019-03-13 ENCOUNTER — Telehealth: Payer: Self-pay | Admitting: Urology

## 2019-03-13 ENCOUNTER — Ambulatory Visit (INDEPENDENT_AMBULATORY_CARE_PROVIDER_SITE_OTHER): Payer: Medicare Other | Admitting: Urology

## 2019-03-13 DIAGNOSIS — C61 Malignant neoplasm of prostate: Secondary | ICD-10-CM

## 2019-03-13 NOTE — Progress Notes (Signed)
This is a prostate cancer patient who no-show today for PSA/Lupron.  He is due for this medication.  Please reach out to him and reschedule soon as possible.  Hollice Espy, MD

## 2019-03-13 NOTE — Telephone Encounter (Signed)
This is a prostate cancer patient who no-show today for PSA/Lupron.  He is due for this medication.  Please reach out to him and reschedule soon as possible.  Hollice Espy, MD

## 2019-03-14 ENCOUNTER — Encounter: Payer: Self-pay | Admitting: Urology

## 2019-04-12 ENCOUNTER — Other Ambulatory Visit: Payer: Self-pay

## 2019-04-12 ENCOUNTER — Ambulatory Visit (INDEPENDENT_AMBULATORY_CARE_PROVIDER_SITE_OTHER): Payer: Medicare Other | Admitting: Urology

## 2019-04-12 ENCOUNTER — Encounter: Payer: Self-pay | Admitting: Urology

## 2019-04-12 DIAGNOSIS — C61 Malignant neoplasm of prostate: Secondary | ICD-10-CM | POA: Diagnosis not present

## 2019-04-12 MED ORDER — LEUPROLIDE ACETATE (4 MONTH) 30 MG IM KIT
30.0000 mg | PACK | Freq: Once | INTRAMUSCULAR | Status: AC
  Administered 2019-04-12: 30 mg via INTRAMUSCULAR

## 2019-04-12 NOTE — Progress Notes (Signed)
04/12/2019 10:47 AM   [name] [place] October 04, 1950 [id number]  Referring provider: Idelle Crouch, MD [facility] [place],  [place] [address]  Chief Complaint  Patient presents with  . Prostate Cancer    Follow up    HPI: 68 y.o. 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  Communication was done with the aid of the interpreter line as per usual.  He is accompanied today by his wife.  PSA was drawn.  Patient was initially diagnosed 06/2017 with Gleason 4+4 prostate cancer involving 9 and 12 cores.  Patient's initial PSA was 5.6.  Staging included CT scan and bone scan negative for evidence of metastatic disease.  Patient is currently undergoing androgen deprivation treatment.  Patient says today that he overall feels like normal, with hot flashes getting less frequent.  He continues on Flomax.  Patient denies any significant urinary symptoms.   He is pleased.    He is due for Lupron today.  He is taking calcium and vitamin D supplementation.  PSA Trend             11/13/2008, 1             04/12/2014, 2.54             10/29/2015, 3.13             04/18/2017, 5.61             05/17/2017, 5.3             03/29/2018, 0.37             08/10/2018, <0.1   PMH: Past Medical History:  Diagnosis Date  . Benign hypertension 05/22/2017  . 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 (brain stem stroke)  . Cancer ([place]) 2018   adenocarcinoma of prostate  . Elevated PSA 2018  . History of vertigo 05/22/2017  . Hyperlipidemia, unspecified 05/22/2017  . Lipoma of neck 05/22/2017   Overview:  right  . Pre-diabetes 2018   HGBA1C RISING    Surgical History: Past Surgical History:  Procedure Laterality Date  . LIPOMA EXCISION Right 2014   RIGHT SIDE OF NECK  . RADIOACTIVE SEED IMPLANT N/A  11/14/2017   Procedure: RADIOACTIVE SEED IMPLANT/BRACHYTHERAPY IMPLANT;  Surgeon: Hollice Espy, MD;  Location: [facility] ORS;  Service: Urology;  Laterality: N/A;    Home Medications:  Allergies as of 04/12/2019   No Known Allergies     Medication List       Accurate as of April 12, 2019 10:47 AM. If you have any questions, ask your nurse or doctor.        amLODipine 5 MG tablet Commonly known as: NORVASC Take 5 mg by mouth daily.   aspirin EC 81 MG tablet Take 81 mg by mouth daily.   atorvastatin 40 MG tablet Commonly known as: LIPITOR Take 40 mg by mouth daily.   docusate sodium 100 MG capsule Commonly known as: COLACE Take 1 capsule (100 mg total) by mouth 2 (two) times daily.   enalapril 20 MG tablet Commonly known as: VASOTEC Take 20 mg by mouth daily.   metoprolol tartrate 50 MG tablet Commonly known as: LOPRESSOR Take 50 mg by mouth daily.   tamsulosin 0.4 MG Caps capsule Commonly known as: Flomax Take 1 capsule (0.4  mg total) by mouth daily.   VITAMIN D3 PO Take 1 capsule by mouth daily.       Allergies: No Known Allergies  Family History: Family History  Problem Relation Age of Onset  . Heart attack Father     Social History:  reports that he quit smoking about 30 years ago. His smoking use included cigarettes. He smoked 1.00 pack per day. He has never used smokeless tobacco. He reports previous alcohol use. He reports that he does not use drugs.  ROS: UROLOGY Frequent Urination?: No Hard to postpone urination?: No Burning/pain with urination?: No Get up at night to urinate?: No Leakage of urine?: No Urine stream starts and stops?: No Trouble starting stream?: No Do you have to strain to urinate?: No Blood in urine?: No Urinary tract infection?: No Sexually transmitted disease?: No Injury to kidneys or bladder?: No Painful intercourse?: No Weak stream?: No Erection problems?: No Penile pain?: No  Gastrointestinal Nausea?: No  Vomiting?: No Indigestion/heartburn?: No Diarrhea?: No Constipation?: No  Constitutional Fever: No Night sweats?: No Weight loss?: No Fatigue?: No  Skin Skin rash/lesions?: No Itching?: No  Eyes Blurred vision?: No Double vision?: No  Ears/Nose/Throat Sore throat?: No Sinus problems?: No  Hematologic/Lymphatic Swollen glands?: No Easy bruising?: No  Cardiovascular Leg swelling?: No Chest pain?: No  Respiratory Cough?: No Shortness of breath?: No  Endocrine Excessive thirst?: No  Musculoskeletal Back pain?: No Joint pain?: No  Neurological Headaches?: No Dizziness?: No  Psychologic Depression?: No Anxiety?: No  Physical Exam: BP (!) 168/88   Pulse 65   Ht 5\' 5"  (1.651 m)   Wt 151 lb (68.5 kg)   BMI 25.13 kg/m   Constitutional:  Alert and oriented, No acute distress.  Accompanied by wife today.  Current interpreter available by phone. HEENT: [place]  AT, moist mucus membranes.  Trachea midline, no masses. Cardiovascular: No clubbing, cyanosis, or edema. Respiratory: Normal respiratory effort, no increased work of breathing. Skin: No rashes, bruises or suspicious lesions. Neurologic: Grossly intact, no focal deficits, moving all 4 extremities. Psychiatric: Normal mood and affect.  Laboratory Data: Lab Results  Component Value Date   WBC 5.7 11/07/2017   HGB 14.7 11/07/2017   HCT 43.6 11/07/2017   MCV 90.1 11/07/2017   PLT 229 11/07/2017    Lab Results  Component Value Date   CREATININE 0.74 11/07/2017    Assessment & Plan:    1. Prostate cancer ([place]) High risk prostate cancer status post IMRT on ADT We will continue ADT for 2 to 3 years, at least until 2021 possibly 2022 Reviewed bone health including vitamin D calcium supplementation 83-month double was given today (6 months ago not available (to which he is agreeable.  He understands we may be transitioning to Eligard due to national back order - PSA   Return in about 4 years (around  04/12/2023) for PSA/ lupron.  Hollice Espy, MD  [facility] [address], [place], [place] [address] [phone]

## 2019-04-12 NOTE — Addendum Note (Signed)
Addended by: Verlene Mayer A on: 04/12/2019 12:10 PM   Modules accepted: Orders

## 2019-04-13 ENCOUNTER — Telehealth: Payer: Self-pay | Admitting: Family Medicine

## 2019-04-13 LAB — PSA: Prostate Specific Ag, Serum: <0.1 ng/mL (ref 0.0–4.0)

## 2019-04-13 NOTE — Telephone Encounter (Signed)
-----   Message from Hollice Espy, MD sent at 04/13/2019  9:37 AM EDT ----- PSA is undetectable.  Great news.  Hollice Espy, MD

## 2019-04-13 NOTE — Telephone Encounter (Signed)
LMOM for patient to return call.

## 2019-04-17 NOTE — Telephone Encounter (Signed)
Called pt's wife, informed her of the information below per DRP. She gave verbal understanding.

## 2019-08-15 ENCOUNTER — Ambulatory Visit: Payer: Medicare Other

## 2019-08-15 ENCOUNTER — Ambulatory Visit (INDEPENDENT_AMBULATORY_CARE_PROVIDER_SITE_OTHER): Payer: Medicare Other

## 2019-08-15 ENCOUNTER — Other Ambulatory Visit: Payer: Self-pay

## 2019-08-15 DIAGNOSIS — C61 Malignant neoplasm of prostate: Secondary | ICD-10-CM

## 2019-08-15 MED ORDER — LEUPROLIDE ACETATE (6 MONTH) 45 MG ~~LOC~~ KIT
45.0000 mg | PACK | Freq: Once | SUBCUTANEOUS | Status: AC
  Administered 2019-08-15: 45 mg via SUBCUTANEOUS

## 2019-08-15 NOTE — Progress Notes (Signed)
[id number]  Eligard SubQ Injection   Due to Prostate Cancer patient is present today for a Eligard Injection.  Medication: Eligard 6 month Dose: 45 mg  Location: left abdomen Lot: [id number] Exp: 04/2021  Patient tolerated well, no complications were noted  Performed by: Fonnie Jarvis, CMA  Per Dr. Erlene Quan patient is to continue therapy for for another 71months and then will re-evaluate . Patient's next follow up was scheduled for 02/13/20. This appointment was scheduled using wheel and given to patient today along with reminder continue on Vitamin D 800-1000iu and Calium 1000-1200mg  daily while on Androgen Deprivation Therapy.

## 2019-08-15 NOTE — Patient Instructions (Signed)

## 2019-08-16 LAB — PSA: Prostate Specific Ag, Serum: <0.1 ng/mL (ref 0.0–4.0)

## 2019-08-20 ENCOUNTER — Telehealth: Payer: Self-pay

## 2019-08-20 NOTE — Telephone Encounter (Signed)
#  [id number]  Patient notified on vmail through translator per [facility]

## 2019-08-20 NOTE — Telephone Encounter (Signed)
-----   Message from Hollice Espy, MD sent at 08/17/2019 11:56 AM EST ----- PSA remains under excellent control.  You your at your next follow up!  [place] translator needed.  Hollice Espy, MD

## 2020-01-28 ENCOUNTER — Telehealth: Payer: Self-pay

## 2020-01-28 NOTE — Telephone Encounter (Signed)
Pt's current insurance showing inactive, called pt's daughter per DPR to inquire about new benefits for pt. ADvised her to call back with up to date information if any.

## 2020-02-01 ENCOUNTER — Telehealth: Payer: Self-pay

## 2020-02-01 NOTE — Telephone Encounter (Signed)
Called pt's wife per DPR. No answer. LM for wife to call back in order to clarify if they have new insurance coverage.

## 2020-02-01 NOTE — Telephone Encounter (Signed)
ERROR

## 2020-02-12 NOTE — Progress Notes (Signed)
02/13/2020 1:40 PM   [name] 02-Mar-1951 [id number]  Referring provider: Idelle Crouch, MD [facility] [place],  [place] [address] Chief Complaint  Patient presents with  . Prostate Cancer    HPI: [name] is a 69 y.o. 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   Communication was done with the aid of the interpreter line as per usual.  He is accompanied today by his wife.  Patient was initially diagnosed 06/2017 with Gleason 4+4 prostate cancer involving 9 and 12 cores.Patient'sinitial PSA was 5.6. Staging included CT scan and bone scan negative for evidence of metastatic disease. Patient is currently undergoing androgen deprivation treatment.  Last Eligard received on 08/15/2019.   In the last few months he has been doing well. Patient will be getting the COVID-19 vaccine.   He is taking calcium and vitamin D.   PSA Trend 11/13/2008, 1 04/12/2014, 2.54 10/29/2015, 3.13 04/18/2017, 5.61 05/17/2017, 5.3 03/29/2018, 0.37 08/10/2018, <0.1  04/12/2019, <0.1  08/15/2019, <0.1   PMH: Past Medical History:  Diagnosis Date  . Benign hypertension 05/22/2017  . 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 (brain stem stroke)  . Cancer ([place]) 2018   adenocarcinoma of prostate  . Elevated PSA 2018  . History of vertigo 05/22/2017  . Hyperlipidemia, unspecified 05/22/2017  . Lipoma of neck 05/22/2017   Overview:  right  . Pre-diabetes 2018   HGBA1C RISING    Surgical History: Past Surgical History:  Procedure Laterality Date  . LIPOMA EXCISION Right 2014   RIGHT SIDE OF NECK  . RADIOACTIVE SEED IMPLANT N/A 11/14/2017   Procedure: RADIOACTIVE SEED IMPLANT/BRACHYTHERAPY IMPLANT;  Surgeon:  Hollice Espy, MD;  Location: [facility] ORS;  Service: Urology;  Laterality: N/A;    Home Medications:  Allergies as of 02/13/2020   No Known Allergies     Medication List       Accurate as of February 13, 2020  1:40 PM. If you have any questions, ask your nurse or doctor.        amLODipine 5 MG tablet Commonly known as: NORVASC Take 5 mg by mouth daily.   aspirin EC 81 MG tablet Take 81 mg by mouth daily.   atorvastatin 40 MG tablet Commonly known as: LIPITOR Take 40 mg by mouth daily.   docusate sodium 100 MG capsule Commonly known as: COLACE Take 1 capsule (100 mg total) by mouth 2 (two) times daily.   enalapril 20 MG tablet Commonly known as: VASOTEC Take 20 mg by mouth daily.   metFORMIN 500 MG tablet Commonly known as: GLUCOPHAGE Take 1 tablet by mouth 2 (two) times daily with a meal.   metoprolol tartrate 50 MG tablet Commonly known as: LOPRESSOR Take 50 mg by mouth daily.   tamsulosin 0.4 MG Caps capsule Commonly known as: Flomax Take 1 capsule (0.4 mg total) by mouth daily.   VITAMIN D3 PO Take 1 capsule by mouth daily.       Allergies: No Known Allergies  Family History: Family History  Problem Relation Age of Onset  . Heart attack Father     Social History:  reports that he quit smoking about 31 years ago. His smoking use included cigarettes. He smoked 1.00 pack per day. He has never used smokeless tobacco. He reports previous alcohol use. He  reports that he does not use drugs.   Physical Exam: BP (!) 159/86   Pulse 63   Ht 5\' 5"  (1.651 m)   Wt 155 lb (70.3 kg)   BMI 25.79 kg/m   Constitutional:  Alert and oriented, No acute distress. Accompanied by wife today. [place] translator present. HEENT: [place] AT, moist mucus membranes.  Trachea midline, no masses. Cardiovascular: No clubbing, cyanosis, or edema. Respiratory: Normal respiratory effort, no increased work of breathing. Skin: No rashes, bruises or suspicious lesions. Neurologic: Grossly  intact, no focal deficits, moving all 4 extremities. Psychiatric: Normal mood and affect.   Assessment & Plan:    1. Prostate cancer ([place]) High risk prostate cancer status post IMRT on ADT We will continue ADT for 2 to 3 years, at least until 2021 possibly 2022 Continues to take vitamin D calcium supplementation No urinary symptoms He is agreeable to take 1 more 53-month Depo today but then likely discontinue thereafter -leuprolide (6 month) (Eligard) injection 45 mg today. -PSA today, will call with results   Return for follow up 6 months with [facility] [address], [place], [place] [address] [phone]  I, Selena Batten, am acting as a scribe for Dr. Hollice Espy.  I have reviewed the above documentation for accuracy and completeness, and I agree with the above.   Hollice Espy, MD

## 2020-02-13 ENCOUNTER — Other Ambulatory Visit: Payer: Self-pay

## 2020-02-13 ENCOUNTER — Ambulatory Visit (INDEPENDENT_AMBULATORY_CARE_PROVIDER_SITE_OTHER): Payer: Medicare Other | Admitting: Urology

## 2020-02-13 ENCOUNTER — Encounter: Payer: Self-pay | Admitting: Urology

## 2020-02-13 VITALS — BP 159/86 | HR 63 | Ht 65.0 in | Wt 155.0 lb

## 2020-02-13 DIAGNOSIS — C61 Malignant neoplasm of prostate: Secondary | ICD-10-CM | POA: Diagnosis not present

## 2020-02-13 MED ORDER — LEUPROLIDE ACETATE (6 MONTH) 45 MG ~~LOC~~ KIT
45.0000 mg | PACK | Freq: Once | SUBCUTANEOUS | Status: AC
  Administered 2020-02-13: 45 mg via SUBCUTANEOUS

## 2020-02-13 NOTE — Progress Notes (Signed)
Interpreter [place]

## 2020-02-14 ENCOUNTER — Telehealth: Payer: Self-pay

## 2020-02-14 LAB — PSA: Prostate Specific Ag, Serum: <0.1 ng/mL (ref 0.0–4.0)

## 2020-02-14 NOTE — Telephone Encounter (Signed)
-----   Message from Hollice Espy, MD sent at 02/14/2020 10:17 AM EDT ----- PSa remains undetectable  Hollice Espy, MD

## 2020-02-14 NOTE — Telephone Encounter (Signed)
sw pt wife. Aware of results. Asked to mail copy.

## 2020-08-19 ENCOUNTER — Other Ambulatory Visit: Payer: Self-pay

## 2020-08-19 ENCOUNTER — Ambulatory Visit (INDEPENDENT_AMBULATORY_CARE_PROVIDER_SITE_OTHER): Payer: Medicare Other | Admitting: Urology

## 2020-08-19 VITALS — BP 158/85 | HR 58 | Ht 65.0 in | Wt 160.0 lb

## 2020-08-19 DIAGNOSIS — R350 Frequency of micturition: Secondary | ICD-10-CM

## 2020-08-19 DIAGNOSIS — C61 Malignant neoplasm of prostate: Secondary | ICD-10-CM | POA: Diagnosis not present

## 2020-08-19 MED ORDER — MIRABEGRON ER 25 MG PO TB24: 25.0000 mg | ORAL_TABLET | Freq: Every day | ORAL | 11 refills | Status: AC

## 2020-08-19 NOTE — Progress Notes (Signed)
Language line used. [place] [phone]

## 2020-08-20 LAB — PSA: Prostate Specific Ag, Serum: <0.1 ng/mL (ref 0.0–4.0)

## 2020-08-21 ENCOUNTER — Encounter: Payer: Self-pay | Admitting: Urology

## 2020-08-21 NOTE — Progress Notes (Unsigned)
08/19/2020 2:21 PM   [name] October 18, 1950 [id number]  Referring provider: Idelle Crouch, MD [place] [facility] [place],  [place] [address]  Chief Complaint  Patient presents with  . Prostate Cancer    HPI: 70 y.o. 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  Communication was done with the aid of the interpreter lineas per usual. He is accompanied today by his wife.  Last and final Eligard administered 01/2020.  PSA remains undetectable as of today.  Patient was initially diagnosed 06/2017 with Gleason 4+4 prostate cancer involving 9 and 12 cores.Patient'sinitial PSA was 5.6. Staging included CT scan and bone scan negative for evidence of metastatic disease. Patient is currently undergoing androgen deprivation treatment.  He continues to have urinary frequency, both daytime and nighttime.  This is bothersome to him.  He does not believe the Flomax is helping him much.  He did previously take Myrbetriq in the past and this was significantly more helpful to him.  He like to start this medication again.   PMH: Past Medical History:  Diagnosis Date  . Benign hypertension 05/22/2017  . 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 (brain stem stroke)  . Cancer ([place]) 2018   adenocarcinoma of prostate  . Elevated PSA 2018  . History of vertigo 05/22/2017  . Hyperlipidemia, unspecified 05/22/2017  . Lipoma of neck 05/22/2017   Overview:  right  . Pre-diabetes 2018   HGBA1C RISING    Surgical History: Past Surgical History:  Procedure Laterality Date  . LIPOMA EXCISION Right 2014   RIGHT SIDE OF NECK  . RADIOACTIVE SEED IMPLANT N/A 11/14/2017   Procedure: RADIOACTIVE SEED IMPLANT/BRACHYTHERAPY IMPLANT;  Surgeon: Hollice Espy, MD;  Location: [facility] ORS;  Service: Urology;  Laterality: N/A;    Home  Medications:  Allergies as of 08/19/2020   No Known Allergies     Medication List       Accurate as of August 19, 2020 11:59 PM. If you have any questions, ask your nurse or doctor.        STOP taking these medications   tamsulosin 0.4 MG Caps capsule Commonly known as: Flomax Stopped by: Hollice Espy, MD     TAKE these medications   amLODipine 5 MG tablet Commonly known as: NORVASC Take 5 mg by mouth daily.   aspirin EC 81 MG tablet Take 81 mg by mouth daily.   atorvastatin 40 MG tablet Commonly known as: LIPITOR Take 40 mg by mouth daily.   docusate sodium 100 MG capsule Commonly known as: COLACE Take 1 capsule (100 mg total) by mouth 2 (two) times daily.   enalapril 20 MG tablet Commonly known as: VASOTEC Take 20 mg by mouth daily.   metFORMIN 500 MG tablet Commonly known as: GLUCOPHAGE Take 1 tablet by mouth 2 (two) times daily with a meal.   metoprolol tartrate 50 MG tablet Commonly known as: LOPRESSOR Take 50 mg by mouth daily.   mirabegron ER 25 MG Tb24 tablet Commonly known as: MYRBETRIQ Take 1 tablet (25 mg total) by mouth daily. Started by: Hollice Espy, MD   VITAMIN D3 PO Take 1 capsule by mouth daily.       Allergies: No Known Allergies  Family History: Family History  Problem Relation Age of Onset  . Heart attack Father     Social  History:  reports that he quit smoking about 32 years ago. His smoking use included cigarettes. He smoked 1.00 pack per day. He has never used smokeless tobacco. He reports previous alcohol use. He reports that he does not use drugs.   Physical Exam: BP (!) 158/85   Pulse (!) 58   Ht 5\' 5"  (1.651 m)   Wt 160 lb (72.6 kg)   BMI 26.63 kg/m   Constitutional:  Alert and oriented, No acute distress. HEENT: [place] AT, moist mucus membranes.  Trachea midline, no masses. Cardiovascular: No clubbing, cyanosis, or edema. Respiratory: Normal respiratory effort, no increased work of breathing. Skin: No rashes,  bruises or suspicious lesions. Neurologic: Grossly intact, no focal deficits, moving all 4 extremities. Psychiatric: Normal mood and affect.  Assessment & Plan:    1. Prostate cancer [facility]) Personal history of these and focus for prostate cancer status post IMRT 2019 and 2.5 years of ADT  ADT now complete, PSA remains undetectable  Repeat PSA in 6 months - PSA; Future - PSA  2. Urinary frequency Okay to stop Flomax as this has not been helpful  He was given Myrbetriq 25 mg again today x1 month and prescription sent to pharmacy.  He has benefited from this.  We will monitor urinary symptoms and PVRs at next follow-up.  Follow-up in 6 months for PSA/IPSS/PVR  Hollice Espy, MD  [place] [address] [place], [place] [address] [phone]

## 2021-02-16 ENCOUNTER — Encounter: Payer: Self-pay | Admitting: Urology

## 2021-02-16 ENCOUNTER — Other Ambulatory Visit: Payer: Self-pay

## 2021-02-18 ENCOUNTER — Ambulatory Visit: Payer: Self-pay | Admitting: Urology

## 2021-07-06 ENCOUNTER — Other Ambulatory Visit: Payer: Medicare Other

## 2021-07-07 NOTE — Progress Notes (Incomplete)
07/07/21 8:52 AM   [name] 10-31-1950 [id number]  Referring provider:  Idelle Crouch, MD [facility] [place],  [place] [address] No chief complaint on file.    HPI: [name] is a 70 y.o.male with a personal history of prostate cancer and urinary frequency, who presents today for 6 month follow-up with PSA.   Patient was initially diagnosed 06/2017 with Gleason 4+4 prostate cancer involving 9 and 12 cores.  Patient's initial PSA was 5.6.  Staging included CT scan and bone scan negative for evidence of metastatic disease. He is s/p IMRT with brachytherapy boost completed 09/2017.   He has is last Eligard injection administered in 01/2020. His PSA has remained undetectable as of 08/19/2020.   His is accompanied today by a [place] interpreter.      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 (brain stem stroke)   Cancer ([place]) 2018   adenocarcinoma of prostate   Elevated PSA 2018   History of vertigo 05/22/2017   Hyperlipidemia, unspecified 05/22/2017   Lipoma of neck 05/22/2017   Overview:  right   Pre-diabetes 2018   HGBA1C RISING    Surgical History: Past Surgical History:  Procedure Laterality Date   LIPOMA EXCISION Right 2014   RIGHT SIDE OF NECK   RADIOACTIVE SEED IMPLANT N/A 11/14/2017   Procedure: RADIOACTIVE SEED IMPLANT/BRACHYTHERAPY IMPLANT;  Surgeon: Hollice Espy, MD;  Location: [facility] ORS;  Service: Urology;  Laterality: N/A;    Home Medications:  Allergies as of 07/08/2021   No Known Allergies      Medication List        Accurate as of July 07, 2021  8:52 AM. If you have any questions, ask your nurse or doctor.          amLODipine 5 MG tablet Commonly known as: NORVASC Take 5 mg by mouth daily.   aspirin EC 81 MG tablet Take 81 mg by mouth daily.   atorvastatin 40 MG  tablet Commonly known as: LIPITOR Take 40 mg by mouth daily.   docusate sodium 100 MG capsule Commonly known as: COLACE Take 1 capsule (100 mg total) by mouth 2 (two) times daily.   enalapril 20 MG tablet Commonly known as: VASOTEC Take 20 mg by mouth daily.   metFORMIN 500 MG tablet Commonly known as: GLUCOPHAGE Take 1 tablet by mouth 2 (two) times daily with a meal.   metoprolol tartrate 50 MG tablet Commonly known as: LOPRESSOR Take 50 mg by mouth daily.   mirabegron ER 25 MG Tb24 tablet Commonly known as: MYRBETRIQ Take 1 tablet (25 mg total) by mouth daily.   VITAMIN D3 PO Take 1 capsule by mouth daily.        Allergies: No Known Allergies  Family History: Family History  Problem Relation Age of Onset   Heart attack Father     Social History:  reports that he quit smoking about 32 years ago. His smoking use included cigarettes. He smoked an average of 1 pack per day. He has never used smokeless tobacco. He reports that he does not currently use alcohol. He reports that he does not use drugs.   Physical Exam: There were no vitals taken for this visit.  Constitutional:  Alert and oriented, No acute distress. HEENT: [place] AT, moist mucus membranes.  Trachea midline, no masses. Cardiovascular: No  clubbing, cyanosis, or edema. Respiratory: Normal respiratory effort, no increased work of breathing. Skin: No rashes, bruises or suspicious lesions. Neurologic: Grossly intact, no focal deficits, moving all 4 extremities. Psychiatric: Normal mood and affect.  Laboratory Data:  Lab Results  Component Value Date   CREATININE 0.74 11/07/2017   Urinalysis   Pertinent Imaging:    Assessment & Plan:     No follow-ups on file.  I,Kailey Littlejohn,acting as a Education administrator for Hollice Espy, MD.,have documented all relevant documentation on the behalf of Hollice Espy, MD,as directed by  Hollice Espy, MD while in the presence of Hollice Espy, [place] [address], [place], [place] [address] [phone]

## 2021-07-08 ENCOUNTER — Ambulatory Visit: Payer: Medicare Other | Admitting: Urology
